# Patient Record
Sex: Female | Born: 1975 | Race: White | Hispanic: No | Marital: Married | Smoking: Current every day smoker
Health system: Southern US, Community
[De-identification: ages and names within clinical notes are randomized; demographics above are authoritative.]

## PROBLEM LIST (undated history)

## (undated) DIAGNOSIS — Z789 Other specified health status: Secondary | ICD-10-CM

## (undated) DIAGNOSIS — M1990 Unspecified osteoarthritis, unspecified site: Secondary | ICD-10-CM

## (undated) HISTORY — DX: Unspecified osteoarthritis, unspecified site: M19.90

---

## 2004-03-24 HISTORY — PX: CHOLECYSTECTOMY: SHX55

## 2012-03-24 HISTORY — PX: APPENDECTOMY: SHX54

## 2012-03-30 ENCOUNTER — Encounter (HOSPITAL_COMMUNITY): Payer: Self-pay | Admitting: Anesthesiology

## 2012-03-30 ENCOUNTER — Inpatient Hospital Stay (HOSPITAL_BASED_OUTPATIENT_CLINIC_OR_DEPARTMENT_OTHER)
Admission: EM | Admit: 2012-03-30 | Discharge: 2012-04-01 | DRG: 883 | Disposition: A | Discharge status: Home / Self Care | Payer: BC Managed Care – PPO | Attending: General Surgery | Admitting: General Surgery

## 2012-03-30 ENCOUNTER — Encounter (HOSPITAL_BASED_OUTPATIENT_CLINIC_OR_DEPARTMENT_OTHER): Payer: Self-pay | Admitting: Family Medicine

## 2012-03-30 ENCOUNTER — Emergency Department (HOSPITAL_COMMUNITY): Payer: BC Managed Care – PPO | Admitting: Anesthesiology

## 2012-03-30 ENCOUNTER — Emergency Department (HOSPITAL_BASED_OUTPATIENT_CLINIC_OR_DEPARTMENT_OTHER): Payer: BC Managed Care – PPO

## 2012-03-30 ENCOUNTER — Encounter (HOSPITAL_COMMUNITY): Admission: EM | Disposition: A | Discharge status: Home / Self Care | Payer: Self-pay | Source: Home / Self Care

## 2012-03-30 ENCOUNTER — Encounter (HOSPITAL_COMMUNITY): Payer: Self-pay | Admitting: *Deleted

## 2012-03-30 DIAGNOSIS — F17200 Nicotine dependence, unspecified, uncomplicated: Secondary | ICD-10-CM | POA: Diagnosis present

## 2012-03-30 DIAGNOSIS — K35209 Acute appendicitis with generalized peritonitis, without abscess, unspecified as to perforation: Principal | ICD-10-CM | POA: Diagnosis present

## 2012-03-30 DIAGNOSIS — Z6831 Body mass index (BMI) 31.0-31.9, adult: Secondary | ICD-10-CM

## 2012-03-30 DIAGNOSIS — K352 Acute appendicitis with generalized peritonitis, without abscess: Principal | ICD-10-CM | POA: Diagnosis present

## 2012-03-30 DIAGNOSIS — K3580 Unspecified acute appendicitis: Secondary | ICD-10-CM

## 2012-03-30 DIAGNOSIS — E669 Obesity, unspecified: Secondary | ICD-10-CM | POA: Diagnosis present

## 2012-03-30 DIAGNOSIS — K5900 Constipation, unspecified: Secondary | ICD-10-CM | POA: Diagnosis present

## 2012-03-30 HISTORY — PX: LAPAROSCOPIC APPENDECTOMY: SHX408

## 2012-03-30 HISTORY — DX: Other specified health status: Z78.9

## 2012-03-30 LAB — CBC WITH DIFFERENTIAL/PLATELET
Basophils Absolute: 0 10*3/uL (ref 0.0–0.1)
Eosinophils Absolute: 0 10*3/uL (ref 0.0–0.7)
Eosinophils Relative: 0 % (ref 0–5)
MCH: 30.1 pg (ref 26.0–34.0)
MCV: 87.6 fL (ref 78.0–100.0)
Platelets: 280 10*3/uL (ref 150–400)
RDW: 14 % (ref 11.5–15.5)

## 2012-03-30 LAB — URINALYSIS, ROUTINE W REFLEX MICROSCOPIC
Bilirubin Urine: NEGATIVE
Ketones, ur: 15 mg/dL — AB
Nitrite: NEGATIVE
Specific Gravity, Urine: 1.021 (ref 1.005–1.030)
Urobilinogen, UA: 1 mg/dL (ref 0.0–1.0)

## 2012-03-30 LAB — URINE MICROSCOPIC-ADD ON

## 2012-03-30 LAB — BASIC METABOLIC PANEL
Calcium: 9 mg/dL (ref 8.4–10.5)
GFR calc non Af Amer: >90 mL/min (ref >90)
Sodium: 137 mEq/L (ref 135–145)

## 2012-03-30 SURGERY — APPENDECTOMY, LAPAROSCOPIC
Anesthesia: General | Site: Abdomen | Wound class: Contaminated

## 2012-03-30 MED ORDER — OXYCODONE-ACETAMINOPHEN 5-325 MG PO TABS
1.0000 | ORAL_TABLET | ORAL | Status: DC | PRN
  Administered 2012-03-31 – 2012-04-01 (×2): 1 via ORAL
  Filled 2012-03-30 (×2): qty 1

## 2012-03-30 MED ORDER — SODIUM CHLORIDE 0.9 % IV SOLN
Freq: Once | INTRAVENOUS | Status: AC
Start: 2012-03-30 — End: 2012-03-30
  Administered 2012-03-30: 14:00:00 via INTRAVENOUS

## 2012-03-30 MED ORDER — 0.9 % SODIUM CHLORIDE (POUR BTL) OPTIME
TOPICAL | Status: DC | PRN
  Administered 2012-03-30: 1000 mL

## 2012-03-30 MED ORDER — MORPHINE SULFATE 2 MG/ML IJ SOLN
2.0000 mg | Freq: Once | INTRAMUSCULAR | Status: AC
  Administered 2012-03-30: 2 mg via INTRAVENOUS
  Filled 2012-03-30: qty 1

## 2012-03-30 MED ORDER — ONDANSETRON HCL 4 MG/2ML IJ SOLN
4.0000 mg | Freq: Once | INTRAMUSCULAR | Status: AC
  Administered 2012-03-30: 4 mg via INTRAVENOUS
  Filled 2012-03-30: qty 2

## 2012-03-30 MED ORDER — GLYCOPYRROLATE 0.2 MG/ML IJ SOLN
INTRAMUSCULAR | Status: DC | PRN
  Administered 2012-03-30: 0.6 mg via INTRAVENOUS

## 2012-03-30 MED ORDER — PROPOFOL 10 MG/ML IV BOLUS
INTRAVENOUS | Status: DC | PRN
  Administered 2012-03-30: 200 mg via INTRAVENOUS

## 2012-03-30 MED ORDER — CIPROFLOXACIN IN D5W 400 MG/200ML IV SOLN
400.0000 mg | Freq: Two times a day (BID) | INTRAVENOUS | Status: DC
  Administered 2012-03-31 – 2012-04-01 (×3): 400 mg via INTRAVENOUS
  Filled 2012-03-30 (×4): qty 200

## 2012-03-30 MED ORDER — MORPHINE SULFATE 4 MG/ML IJ SOLN: 6.0000 mg | Freq: Once | INTRAMUSCULAR | Status: DC

## 2012-03-30 MED ORDER — ACETAMINOPHEN 10 MG/ML IV SOLN
INTRAVENOUS | Status: DC | PRN
  Administered 2012-03-30: 1000 mg via INTRAVENOUS

## 2012-03-30 MED ORDER — LACTATED RINGERS IV SOLN
INTRAVENOUS | Status: DC | PRN
  Administered 2012-03-30: 21:00:00 via INTRAVENOUS

## 2012-03-30 MED ORDER — HYDROMORPHONE HCL PF 1 MG/ML IJ SOLN
INTRAMUSCULAR | Status: DC | PRN
  Administered 2012-03-30: 0.5 mg via INTRAVENOUS

## 2012-03-30 MED ORDER — MORPHINE SULFATE 4 MG/ML IJ SOLN: 4.0000 mg | INTRAMUSCULAR | Status: DC | PRN

## 2012-03-30 MED ORDER — HYDROMORPHONE HCL PF 1 MG/ML IJ SOLN
1.0000 mg | Freq: Once | INTRAMUSCULAR | Status: AC
  Administered 2012-03-30: 1 mg via INTRAVENOUS
  Filled 2012-03-30: qty 1

## 2012-03-30 MED ORDER — LACTATED RINGERS IR SOLN
Status: DC | PRN
  Administered 2012-03-30: 1000 mL

## 2012-03-30 MED ORDER — BUPIVACAINE-EPINEPHRINE PF 0.25-1:200000 % IJ SOLN
INTRAMUSCULAR | Status: DC | PRN
  Administered 2012-03-30: 12 mL

## 2012-03-30 MED ORDER — DEXAMETHASONE SODIUM PHOSPHATE 4 MG/ML IJ SOLN
INTRAMUSCULAR | Status: DC | PRN
  Administered 2012-03-30: 10 mg via INTRAVENOUS

## 2012-03-30 MED ORDER — IOHEXOL 300 MG/ML  SOLN
36.0000 mL | Freq: Once | INTRAMUSCULAR | Status: AC | PRN
  Administered 2012-03-30: 36 mL via ORAL

## 2012-03-30 MED ORDER — METRONIDAZOLE IN NACL 5-0.79 MG/ML-% IV SOLN
500.0000 mg | Freq: Three times a day (TID) | INTRAVENOUS | Status: DC
  Administered 2012-03-30 – 2012-04-01 (×5): 500 mg via INTRAVENOUS
  Filled 2012-03-30 (×7): qty 100

## 2012-03-30 MED ORDER — ROCURONIUM BROMIDE 100 MG/10ML IV SOLN
INTRAVENOUS | Status: DC | PRN
  Administered 2012-03-30: 20 mg via INTRAVENOUS

## 2012-03-30 MED ORDER — KETAMINE HCL 10 MG/ML IJ SOLN
INTRAMUSCULAR | Status: DC | PRN
  Administered 2012-03-30: 2 mg via INTRAVENOUS
  Administered 2012-03-30: 25 mg via INTRAVENOUS
  Administered 2012-03-30 (×2): 2 mg via INTRAVENOUS

## 2012-03-30 MED ORDER — NEOSTIGMINE METHYLSULFATE 1 MG/ML IJ SOLN
INTRAMUSCULAR | Status: DC | PRN
  Administered 2012-03-30: 5 mg via INTRAVENOUS

## 2012-03-30 MED ORDER — METOCLOPRAMIDE HCL 5 MG/ML IJ SOLN
INTRAMUSCULAR | Status: DC | PRN
  Administered 2012-03-30: 10 mg via INTRAVENOUS

## 2012-03-30 MED ORDER — KCL IN DEXTROSE-NACL 20-5-0.45 MEQ/L-%-% IV SOLN
INTRAVENOUS | Status: DC
  Administered 2012-03-30: 23:00:00 via INTRAVENOUS
  Filled 2012-03-30 (×4): qty 1000

## 2012-03-30 MED ORDER — HYDROMORPHONE HCL PF 1 MG/ML IJ SOLN: 0.2500 mg | INTRAMUSCULAR | Status: DC | PRN

## 2012-03-30 MED ORDER — OXYCODONE HCL 5 MG/5ML PO SOLN
5.0000 mg | Freq: Once | ORAL | Status: DC | PRN
  Filled 2012-03-30: qty 5

## 2012-03-30 MED ORDER — ENOXAPARIN SODIUM 40 MG/0.4ML ~~LOC~~ SOLN
40.0000 mg | SUBCUTANEOUS | Status: DC
  Administered 2012-03-31: 40 mg via SUBCUTANEOUS
  Filled 2012-03-30 (×2): qty 0.4

## 2012-03-30 MED ORDER — IOHEXOL 300 MG/ML  SOLN
100.0000 mL | Freq: Once | INTRAMUSCULAR | Status: AC | PRN
  Administered 2012-03-30: 100 mL via INTRAVENOUS

## 2012-03-30 MED ORDER — CIPROFLOXACIN IN D5W 400 MG/200ML IV SOLN
400.0000 mg | Freq: Once | INTRAVENOUS | Status: AC
  Administered 2012-03-30: 400 mg via INTRAVENOUS
  Filled 2012-03-30: qty 200

## 2012-03-30 MED ORDER — ACETAMINOPHEN 10 MG/ML IV SOLN
INTRAVENOUS | Status: AC
  Filled 2012-03-30: qty 100

## 2012-03-30 MED ORDER — OXYCODONE HCL 5 MG PO TABS: 5.0000 mg | ORAL_TABLET | Freq: Once | ORAL | Status: DC | PRN

## 2012-03-30 MED ORDER — ACETAMINOPHEN 325 MG PO TABS: 650.0000 mg | ORAL_TABLET | ORAL | Status: DC | PRN

## 2012-03-30 MED ORDER — SUCCINYLCHOLINE CHLORIDE 20 MG/ML IJ SOLN
INTRAMUSCULAR | Status: DC | PRN
  Administered 2012-03-30: 100 mg via INTRAVENOUS

## 2012-03-30 MED ORDER — ACETAMINOPHEN 10 MG/ML IV SOLN: 1000.0000 mg | Freq: Once | INTRAVENOUS | Status: DC | PRN

## 2012-03-30 MED ORDER — ONDANSETRON HCL 4 MG/2ML IJ SOLN
INTRAMUSCULAR | Status: DC | PRN
  Administered 2012-03-30: 4 mg via INTRAVENOUS

## 2012-03-30 MED ORDER — PHENYLEPHRINE HCL 10 MG/ML IJ SOLN
INTRAMUSCULAR | Status: DC | PRN
  Administered 2012-03-30: 40 ug via INTRAVENOUS

## 2012-03-30 MED ORDER — MORPHINE SULFATE 4 MG/ML IJ SOLN
4.0000 mg | Freq: Once | INTRAMUSCULAR | Status: AC
  Administered 2012-03-30: 4 mg via INTRAVENOUS
  Filled 2012-03-30: qty 1

## 2012-03-30 MED ORDER — BUPIVACAINE-EPINEPHRINE 0.25% -1:200000 IJ SOLN
INTRAMUSCULAR | Status: AC
  Filled 2012-03-30: qty 1

## 2012-03-30 MED ORDER — FENTANYL CITRATE 0.05 MG/ML IJ SOLN
INTRAMUSCULAR | Status: DC | PRN
  Administered 2012-03-30 (×2): 50 ug via INTRAVENOUS

## 2012-03-30 MED ORDER — KETOROLAC TROMETHAMINE 30 MG/ML IJ SOLN
30.0000 mg | Freq: Four times a day (QID) | INTRAMUSCULAR | Status: DC
  Administered 2012-03-30 – 2012-04-01 (×6): 30 mg via INTRAVENOUS
  Filled 2012-03-30 (×6): qty 1

## 2012-03-30 MED ORDER — PROMETHAZINE HCL 25 MG/ML IJ SOLN: 6.2500 mg | INTRAMUSCULAR | Status: DC | PRN

## 2012-03-30 MED ORDER — MEPERIDINE HCL 50 MG/ML IJ SOLN: 6.2500 mg | INTRAMUSCULAR | Status: DC | PRN

## 2012-03-30 MED ORDER — ONDANSETRON HCL 4 MG/2ML IJ SOLN: 4.0000 mg | Freq: Four times a day (QID) | INTRAMUSCULAR | Status: DC | PRN

## 2012-03-30 MED ORDER — ONDANSETRON HCL 4 MG PO TABS: 4.0000 mg | ORAL_TABLET | Freq: Four times a day (QID) | ORAL | Status: DC | PRN

## 2012-03-30 SURGICAL SUPPLY — 50 items
APPLIER CLIP 5 13 M/L LIGAMAX5 (MISCELLANEOUS)
APPLIER CLIP ROT 10 11.4 M/L (STAPLE)
BENZOIN TINCTURE PRP APPL 2/3 (GAUZE/BANDAGES/DRESSINGS) ×2 IMPLANT
CANISTER SUCTION 2500CC (MISCELLANEOUS) ×2 IMPLANT
CLIP APPLIE 5 13 M/L LIGAMAX5 (MISCELLANEOUS) IMPLANT
CLIP APPLIE ROT 10 11.4 M/L (STAPLE) IMPLANT
CLOTH BEACON ORANGE TIMEOUT ST (SAFETY) ×2 IMPLANT
CORD HIGH FREQUENCY UNIPOLAR (ELECTROSURGICAL) ×2 IMPLANT
CUTTER FLEX LINEAR 45M (STAPLE) ×2 IMPLANT
DECANTER SPIKE VIAL GLASS SM (MISCELLANEOUS) ×2 IMPLANT
DRAPE LAPAROSCOPIC ABDOMINAL (DRAPES) ×2 IMPLANT
DRAPE UTILITY XL STRL (DRAPES) ×2 IMPLANT
DRSG TEGADERM 2-3/8X2-3/4 SM (GAUZE/BANDAGES/DRESSINGS) ×4 IMPLANT
DRSG TEGADERM 4X4.75 (GAUZE/BANDAGES/DRESSINGS) ×2 IMPLANT
ELECT REM PT RETURN 9FT ADLT (ELECTROSURGICAL) ×2
ELECTRODE REM PT RTRN 9FT ADLT (ELECTROSURGICAL) ×1 IMPLANT
FILTER SMOKE EVAC LAPAROSHD (FILTER) IMPLANT
GAUZE SPONGE 2X2 8PLY STRL LF (GAUZE/BANDAGES/DRESSINGS) ×1 IMPLANT
GLOVE BIO SURGEON STRL SZ7 (GLOVE) ×2 IMPLANT
GLOVE BIOGEL PI IND STRL 7.0 (GLOVE) ×1 IMPLANT
GLOVE BIOGEL PI IND STRL 7.5 (GLOVE) ×1 IMPLANT
GLOVE BIOGEL PI INDICATOR 7.0 (GLOVE) ×1
GLOVE BIOGEL PI INDICATOR 7.5 (GLOVE) ×1
GLOVE SURG SS PI 8.5 STRL IVOR (GLOVE) ×2
GLOVE SURG SS PI 8.5 STRL STRW (GLOVE) ×2 IMPLANT
GOWN PREVENTION PLUS XXLARGE (GOWN DISPOSABLE) ×2 IMPLANT
GOWN STRL NON-REIN LRG LVL3 (GOWN DISPOSABLE) ×2 IMPLANT
GOWN STRL REIN XL XLG (GOWN DISPOSABLE) IMPLANT
HAND ACTIVATED (MISCELLANEOUS) ×2 IMPLANT
IV LACTATED RINGERS 1000ML (IV SOLUTION) ×2 IMPLANT
KIT BASIN OR (CUSTOM PROCEDURE TRAY) ×2 IMPLANT
NS IRRIG 1000ML POUR BTL (IV SOLUTION) ×2 IMPLANT
PENCIL BUTTON HOLSTER BLD 10FT (ELECTRODE) IMPLANT
POUCH SPECIMEN RETRIEVAL 10MM (ENDOMECHANICALS) ×2 IMPLANT
RELOAD 45 VASCULAR/THIN (ENDOMECHANICALS) IMPLANT
RELOAD STAPLE TA45 3.5 REG BLU (ENDOMECHANICALS) ×2 IMPLANT
SCISSORS LAP 5X35 DISP (ENDOMECHANICALS) ×2 IMPLANT
SET IRRIG TUBING LAPAROSCOPIC (IRRIGATION / IRRIGATOR) ×2 IMPLANT
SOLUTION ANTI FOG 6CC (MISCELLANEOUS) ×2 IMPLANT
SPONGE GAUZE 2X2 STER 10/PKG (GAUZE/BANDAGES/DRESSINGS) ×1
STRIP CLOSURE SKIN 1/2X4 (GAUZE/BANDAGES/DRESSINGS) ×2 IMPLANT
SUT MNCRL AB 4-0 PS2 18 (SUTURE) ×2 IMPLANT
SUT VICRYL 0 ENDOLOOP (SUTURE) IMPLANT
TOWEL OR 17X26 10 PK STRL BLUE (TOWEL DISPOSABLE) ×2 IMPLANT
TRAY FOLEY CATH 14FRSI W/METER (CATHETERS) IMPLANT
TRAY LAP CHOLE (CUSTOM PROCEDURE TRAY) ×2 IMPLANT
TROCAR BLADELESS OPT 5 75 (ENDOMECHANICALS) ×4 IMPLANT
TROCAR XCEL 12X100 BLDLESS (ENDOMECHANICALS) IMPLANT
TROCAR XCEL BLUNT TIP 100MML (ENDOMECHANICALS) ×2 IMPLANT
TUBING INSUFFLATION 10FT LAP (TUBING) ×2 IMPLANT

## 2012-03-30 NOTE — ED Notes (Signed)
Patient transported to X-ray 

## 2012-03-30 NOTE — ED Notes (Signed)
Pt c/o lower abdominal pain x 2 days, constant and sharp. Pt c/o nausea and sts she had 1 episode of vomiting this morning. Pt sts last bowel movement was last Thursday and normally bowel movements are daily.

## 2012-03-30 NOTE — Preoperative (Signed)
Beta Blockers   Reason not to administer Beta Blockers:Not Applicable 

## 2012-03-30 NOTE — ED Provider Notes (Signed)
History     CSN: [id number]  Arrival date & time 03/30/12  1147   First MD Initiated Contact with Patient 03/30/12 1222      Chief Complaint  Patient presents with  . Abdominal Pain    (Consider location/radiation/quality/duration/timing/severity/associated sxs/prior treatment) Patient is a 36 y.o. female presenting with abdominal pain. The history is provided by the patient.  Abdominal Pain The primary symptoms of the illness include abdominal pain, nausea and vomiting. The primary symptoms of the illness do not include fever, diarrhea, dysuria, vaginal discharge or vaginal bleeding. The current episode started yesterday. The onset of the illness was sudden. The problem has been gradually worsening.  Additional symptoms associated with the illness include anorexia and constipation. Symptoms associated with the illness do not include chills, diaphoresis, urgency, hematuria, frequency or back pain.  PT states pain mainly in the lower abdomen. Nausea, vomiting today. No fever. No urinary symptoms. Last bowel movement 4-5 days ago. Did not take any medications prior to the arrival. Pain worsened with palpation, movement.   History reviewed. No pertinent past medical history.  Past Surgical History  Procedure Date  . Cholecystectomy   . Cesarean section     No family history on file.  History  Substance Use Topics  . Smoking status: Current Every Day Smoker  . Smokeless tobacco: Not on file  . Alcohol Use: No    OB History    Grav Para Term Preterm Abortions TAB SAB Ect Mult Living                  Review of Systems  Constitutional: Negative for fever, chills and diaphoresis.  Respiratory: Negative.   Cardiovascular: Negative.   Gastrointestinal: Positive for nausea, vomiting, abdominal pain, constipation and anorexia. Negative for diarrhea and blood in stool.  Genitourinary: Negative for dysuria, urgency, frequency, hematuria, vaginal bleeding and vaginal discharge.    Musculoskeletal: Negative for back pain.  Skin: Negative.   Neurological: Negative for dizziness, weakness and headaches.    Allergies  Penicillins  Home Medications  No current outpatient prescriptions on file.  BP 119/76  Pulse 74  Temp 97.8 F (36.6 C) (Oral)  Resp 18  Ht 5\' 5"  (1.651 m)  Wt 192 lb (87.091 kg)  BMI 31.95 kg/m2  SpO2 99%  LMP 03/22/2012  Physical Exam  Nursing note and vitals reviewed. Constitutional: She appears well-developed and well-nourished. No distress.  HENT:  Head: Normocephalic.  Eyes: Conjunctivae normal are normal.  Neck: Neck supple.  Cardiovascular: Normal rate, regular rhythm and normal heart sounds.   Pulmonary/Chest: Effort normal. No respiratory distress. She has no wheezes. She has no rales.  Abdominal: Soft. Bowel sounds are normal. There is no rebound and no guarding.       Obese. LLQ and RLQ tenderness  Neurological: She is alert.  Skin: Skin is warm and dry.    ED Course  Procedures (including critical care time)    Results for orders placed during the hospital encounter of 03/30/12  URINALYSIS, ROUTINE W REFLEX MICROSCOPIC      Component Value Range   Color, Urine YELLOW  YELLOW   APPearance CLOUDY (*) CLEAR   Specific Gravity, Urine 1.021  1.005 - 1.030   pH 5.5  5.0 - 8.0   Glucose, UA NEGATIVE  NEGATIVE mg/dL   Hgb urine dipstick TRACE (*) NEGATIVE   Bilirubin Urine NEGATIVE  NEGATIVE   Ketones, ur 15 (*) NEGATIVE mg/dL   Protein, ur NEGATIVE  NEGATIVE mg/dL  Urobilinogen, UA 1.0  0.0 - 1.0 mg/dL   Nitrite NEGATIVE  NEGATIVE   Leukocytes, UA TRACE (*) NEGATIVE  PREGNANCY, URINE      Component Value Range   Preg Test, Ur NEGATIVE  NEGATIVE  CBC WITH DIFFERENTIAL      Component Value Range   WBC 15.4 (*) 4.0 - 10.5 K/uL   RBC 4.28  3.87 - 5.11 MIL/uL   Hemoglobin 12.9  12.0 - 15.0 g/dL   HCT 16.1  09.6 - 04.5 %   MCV 87.6  78.0 - 100.0 fL   MCH 30.1  26.0 - 34.0 pg   MCHC 34.4  30.0 - 36.0 g/dL   RDW  40.9  81.1 - 91.4 %   Platelets 280  150 - 400 K/uL   Neutrophils Relative 87 (*) 43 - 77 %   Neutro Abs 13.4 (*) 1.7 - 7.7 K/uL   Lymphocytes Relative 8 (*) 12 - 46 %   Lymphs Abs 1.2  0.7 - 4.0 K/uL   Monocytes Relative 5  3 - 12 %   Monocytes Absolute 0.7  0.1 - 1.0 K/uL   Eosinophils Relative 0  0 - 5 %   Eosinophils Absolute 0.0  0.0 - 0.7 K/uL   Basophils Relative 0  0 - 1 %   Basophils Absolute 0.0  0.0 - 0.1 K/uL  BASIC METABOLIC PANEL      Component Value Range   Sodium 137  135 - 145 mEq/L   Potassium 3.7  3.5 - 5.1 mEq/L   Chloride 103  96 - 112 mEq/L   CO2 22  19 - 32 mEq/L   Glucose, Bld 112 (*) 70 - 99 mg/dL   BUN 6  6 - 23 mg/dL   Creatinine, Ser 7.82  0.50 - 1.10 mg/dL   Calcium 9.0  8.4 - 95.6 mg/dL   GFR calc non Af Amer >90  >90 mL/min   GFR calc Af Amer >90  >90 mL/min  URINE MICROSCOPIC-ADD ON      Component Value Range   Squamous Epithelial / LPF RARE  RARE   WBC, UA 0-2  <3 WBC/hpf   RBC / HPF 3-6  <3 RBC/hpf   Bacteria, UA RARE  RARE   Urine-Other MUCOUS PRESENT     Ct Abdomen Pelvis W Contrast  03/30/2012  *RADIOLOGY REPORT*  Clinical Data: Lower abdominal pain.  Vomiting.  CT ABDOMEN AND PELVIS WITH CONTRAST  Technique:  Multidetector CT imaging of the abdomen and pelvis was performed following the standard protocol during bolus administration of intravenous contrast.  Contrast: 36mL OMNIPAQUE IOHEXOL 300 MG/ML  SOLN, OMNIPAQUE IOHEXOL 300 MG/ML  SOLN  Comparison:  03/30/2012  Findings:  Contrast in the esophagus suggests gastroesophageal reflux.  The liver, spleen, pancreas, and adrenal glands appear unremarkable.  Gallbladder surgically absent.  2 mm left kidney upper pole nonobstructive calculus observed.  No right-sided calculus or ureteral calculus.  Acute appendicitis noted with appendiceal diameter at 1.2 cm and with periappendiceal stranding.  Adjacent reactive lymph nodes are present.  The cecum is in the lower anterior pelvis, with the  appendix just above the anterior to the right adnexa.  There appears to be a sigmoid diverticulum on image 69 of series 2 adjacent to the inflamed appendix, but the diverticulum is not thought to be the source of the inflammation.  A small amount of free pelvic fluid is present in the cul-de-sac. Urinary bladder unremarkable.  Intervertebral spurring noted at the  L5-S1 level potentially contributing to mild right foraminal stenosis.  No extraluminal gas or discrete abscess observed.  IMPRESSION:  1.  Acute unruptured appendicitis with surrounding inflammatory stranding. 2.  Nonobstructive left nephrolithiasis. 3.  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 4.  Small amount of free pelvic fluid. 5.  Intervertebral spurring at L5-S1 contributes to mild right foraminal stenosis. 6.  Suspected gastroesophageal reflux.  I discussed the presence of appendicitis by telephone with Dr. Gwyneth Sprout at 3:48 p.m. on 03/30/2012.   Original Report Authenticated By: Dellia Cloud, M.D.    Dg Abd 2 Views  03/30/2012  *RADIOLOGY REPORT*  Clinical Data: Small bowel obstruction  ABDOMEN - 2 VIEW  Comparison: None.  Findings: There is no free intraperitoneal gas.   Nonspecific air fluid levels.  Post cholecystectomy staples in place.  No obvious bowel wall thickening or bowel dilatation. Tiny calcification projects over the left renal shadow.  IMPRESSION: Nonobstructive bowel gas pattern.  No free intraperitoneal gas. Possible left nephrolithiasis.   Original Report Authenticated By: Donavan Burnet, M.D.    CT showing acute appy, no perforation or abscess.  Spoke with Air traffic controller. Dr. Ezzard Standing accepted transfer to [facility], Dr. Margaree Mackintosh to take to surgery. Pt feeling better. Advised NOT to start antibiotic prior to transfer.   1. Acute appendicitis       MDM          Lottie Mussel, PA 03/31/12 0031

## 2012-03-30 NOTE — H&P (Signed)
Deborah Dodson is an 36 y.o. female.   Chief Complaint: Lower abdominal pain/ nausea/ vomiting HPI: 36 yo female in good health presents with 2 days of lower abdominal pain that has become more severe, nausea, vomiting. She has been constipated for several days.  She presented to [facility] for evaluation and was diagnosed with acute appendicitis. She is transferred to [facility] for further evaluation.  Past Medical History  Diagnosis Date  . No pertinent past medical history     Past Surgical History  Procedure Date  . Cholecystectomy   . Cesarean section     History reviewed. No pertinent family history. Social History:  reports that she has been smoking Cigarettes.  She has a 8.5 pack-year smoking history. She has never used smokeless tobacco. She reports that she does not drink alcohol or use illicit drugs.  Allergies:  Allergies  Allergen Reactions  . Penicillins      (Not in a hospital admission)  Results for orders placed during the hospital encounter of 03/30/12 (from the past 48 hour(s))  URINALYSIS, ROUTINE W REFLEX MICROSCOPIC     Status: Abnormal   Collection Time   03/30/12 12:33 PM      Component Value Range Comment   Color, Urine YELLOW  YELLOW    APPearance CLOUDY (*) CLEAR    Specific Gravity, Urine 1.021  1.005 - 1.030    pH 5.5  5.0 - 8.0    Glucose, UA NEGATIVE  NEGATIVE mg/dL    Hgb urine dipstick TRACE (*) NEGATIVE    Bilirubin Urine NEGATIVE  NEGATIVE    Ketones, ur 15 (*) NEGATIVE mg/dL    Protein, ur NEGATIVE  NEGATIVE mg/dL    Urobilinogen, UA 1.0  0.0 - 1.0 mg/dL    Nitrite NEGATIVE  NEGATIVE    Leukocytes, UA TRACE (*) NEGATIVE   PREGNANCY, URINE     Status: Normal   Collection Time   03/30/12 12:33 PM      Component Value Range Comment   Preg Test, Ur NEGATIVE  NEGATIVE   URINE MICROSCOPIC-ADD ON     Status: Normal   Collection Time   03/30/12 12:33 PM      Component Value Range Comment   Squamous Epithelial / LPF RARE  RARE    WBC, UA 0-2  <3  WBC/hpf    RBC / HPF 3-6  <3 RBC/hpf    Bacteria, UA RARE  RARE    Urine-Other MUCOUS PRESENT     CBC WITH DIFFERENTIAL     Status: Abnormal   Collection Time   03/30/12  1:55 PM      Component Value Range Comment   WBC 15.4 (*) 4.0 - 10.5 K/uL    RBC 4.28  3.87 - 5.11 MIL/uL    Hemoglobin 12.9  12.0 - 15.0 g/dL    HCT 16.1  09.6 - 04.5 %    MCV 87.6  78.0 - 100.0 fL    MCH 30.1  26.0 - 34.0 pg    MCHC 34.4  30.0 - 36.0 g/dL    RDW 40.9  81.1 - 91.4 %    Platelets 280  150 - 400 K/uL    Neutrophils Relative 87 (*) 43 - 77 %    Neutro Abs 13.4 (*) 1.7 - 7.7 K/uL    Lymphocytes Relative 8 (*) 12 - 46 %    Lymphs Abs 1.2  0.7 - 4.0 K/uL    Monocytes Relative 5  3 - 12 %  Monocytes Absolute 0.7  0.1 - 1.0 K/uL    Eosinophils Relative 0  0 - 5 %    Eosinophils Absolute 0.0  0.0 - 0.7 K/uL    Basophils Relative 0  0 - 1 %    Basophils Absolute 0.0  0.0 - 0.1 K/uL   BASIC METABOLIC PANEL     Status: Abnormal   Collection Time   03/30/12  1:55 PM      Component Value Range Comment   Sodium 137  135 - 145 mEq/L    Potassium 3.7  3.5 - 5.1 mEq/L    Chloride 103  96 - 112 mEq/L    CO2 22  19 - 32 mEq/L    Glucose, Bld 112 (*) 70 - 99 mg/dL    BUN 6  6 - 23 mg/dL    Creatinine, Ser 4.09  0.50 - 1.10 mg/dL    Calcium 9.0  8.4 - 81.1 mg/dL    GFR calc non Af Amer >90  >90 mL/min    GFR calc Af Amer >90  >90 mL/min    Ct Abdomen Pelvis W Contrast  03/30/2012  *RADIOLOGY REPORT*  Clinical Data: Lower abdominal pain.  Vomiting.  CT ABDOMEN AND PELVIS WITH CONTRAST  Technique:  Multidetector CT imaging of the abdomen and pelvis was performed following the standard protocol during bolus administration of intravenous contrast.  Contrast: 36mL OMNIPAQUE IOHEXOL 300 MG/ML  SOLN, OMNIPAQUE IOHEXOL 300 MG/ML  SOLN  Comparison:  03/30/2012  Findings:  Contrast in the esophagus suggests gastroesophageal reflux.  The liver, spleen, pancreas, and adrenal glands appear unremarkable.  Gallbladder  surgically absent.  2 mm left kidney upper pole nonobstructive calculus observed.  No right-sided calculus or ureteral calculus.  Acute appendicitis noted with appendiceal diameter at 1.2 cm and with periappendiceal stranding.  Adjacent reactive lymph nodes are present.  The cecum is in the lower anterior pelvis, with the appendix just above the anterior to the right adnexa.  There appears to be a sigmoid diverticulum on image 69 of series 2 adjacent to the inflamed appendix, but the diverticulum is not thought to be the source of the inflammation.  A small amount of free pelvic fluid is present in the cul-de-sac. Urinary bladder unremarkable.  Intervertebral spurring noted at the L5-S1 level potentially contributing to mild right foraminal stenosis.  No extraluminal gas or discrete abscess observed.  IMPRESSION:  1.  Acute unruptured appendicitis with surrounding inflammatory stranding. 2.  Nonobstructive left nephrolithiasis. 3.  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 4.  Small amount of free pelvic fluid. 5.  Intervertebral spurring at L5-S1 contributes to mild right foraminal stenosis. 6.  Suspected gastroesophageal reflux.  I discussed the presence of appendicitis by telephone with Dr. Gwyneth Sprout at 3:48 p.m. on 03/30/2012.   Original Report Authenticated By: Dellia Cloud, M.D.    Dg Abd 2 Views  03/30/2012  *RADIOLOGY REPORT*  Clinical Data: Small bowel obstruction  ABDOMEN - 2 VIEW  Comparison: None.  Findings: There is no free intraperitoneal gas.   Nonspecific air fluid levels.  Post cholecystectomy staples in place.  No obvious bowel wall thickening or bowel dilatation. Tiny calcification projects over the left renal shadow.  IMPRESSION: Nonobstructive bowel gas pattern.  No free intraperitoneal gas. Possible left nephrolithiasis.   Original Report Authenticated By: Donavan Burnet, M.D.     Review of Systems  Eyes: Negative.   Gastrointestinal: Positive for nausea, vomiting,  abdominal pain and constipation.  Neurological:  Negative.   Endo/Heme/Allergies: Negative.     Blood pressure 98/61, pulse 91, temperature 99.2 F (37.3 C), temperature source Oral, resp. rate 19, height 5\' 5"  (1.651 m), weight 192 lb (87.091 kg), last menstrual period 03/22/2012, SpO2 100.00%. Physical Exam  WDWN - mild distress Head: Normocephalic.  Eyes: Conjunctivae normal are normal.  Neck: Neck supple.  Cardiovascular: Normal rate, regular rhythm and normal heart sounds.  Pulmonary/Chest: Effort normal. No respiratory distress. She has no wheezes. She has no rales.  Abdominal: Soft. Bowel sounds are normal.  Obese. Tender in lower abdomen Neurological: She is alert.  Skin: Skin is warm and dry.   Assessment/Plan Acute appendicitis  Laparoscopic appendectomy, possible open appendectomy.  The surgical procedure has been discussed with the patient.  Potential risks, benefits, alternative treatments, and expected outcomes have been explained.  All of the patient's questions at this time have been answered.  The likelihood of reaching the patient's treatment goal is good.  The patient understand the proposed surgical procedure and wishes to proceed.   Daxtyn Rottenberg K. 03/30/2012, 7:34 PM

## 2012-03-30 NOTE — Anesthesia Postprocedure Evaluation (Signed)
Anesthesia Post Note  Patient: Deborah Dodson  Procedure(s) Performed: Procedure(s) (LRB): APPENDECTOMY LAPAROSCOPIC (N/A)  Anesthesia type: General  Patient location: PACU  Post pain: Pain level controlled  Post assessment: Post-op Vital signs reviewed  Last Vitals: BP 124/64  Pulse 107  Temp 37.8 C (Oral)  Resp 18  Ht 5\' 5"  (1.651 m)  Wt 192 lb (87.091 kg)  BMI 31.95 kg/m2  SpO2 98%  LMP 03/22/2012  Post vital signs: Reviewed  Level of consciousness: sedated  Complications: No apparent anesthesia complications

## 2012-03-30 NOTE — Anesthesia Preprocedure Evaluation (Addendum)
Anesthesia Evaluation  Patient identified by MRN, date of birth, ID band Patient awake    Reviewed: Allergy & Precautions, H&P , NPO status , Patient's Chart, lab work & pertinent test results  Airway Mallampati: III TM Distance: >3 FB Neck ROM: Full    Dental  (+) Dental Advisory Given and Teeth Intact   Pulmonary Current Smoker,  breath sounds clear to auscultation  Pulmonary exam normal       Cardiovascular - CAD and - CHF negative cardio ROS  Rhythm:Regular Rate:Normal     Neuro/Psych negative neurological ROS  negative psych ROS   GI/Hepatic negative GI ROS, Neg liver ROS,   Endo/Other  negative endocrine ROS  Renal/GU negative Renal ROS     Musculoskeletal negative musculoskeletal ROS (+)   Abdominal   Peds  Hematology negative hematology ROS (+)   Anesthesia Other Findings   Reproductive/Obstetrics                         Anesthesia Physical Anesthesia Plan  ASA: II  Anesthesia Plan: General   Post-op Pain Management:    Induction: Rapid sequence and Intravenous  Airway Management Planned: Oral ETT  Additional Equipment:   Intra-op Plan:   Post-operative Plan: Extubation in OR  Informed Consent: I have reviewed the patients History and Physical, chart, labs and discussed the procedure including the risks, benefits and alternatives for the proposed anesthesia with the patient or authorized representative who has indicated his/her understanding and acceptance.   Dental advisory given  Plan Discussed with: CRNA  Anesthesia Plan Comments:         Anesthesia Quick Evaluation

## 2012-03-30 NOTE — Op Note (Signed)
Appendectomy, Lap, Procedure Note  Indications: The patient presented with a history of right-sided abdominal pain. A CT scan revealed findings consistent with acute appendicitis.  Pre-operative Diagnosis: Acute appendicitis with generalized peritonitis  Post-operative Diagnosis: Perforated appendicitis with generalized peritonitis  Surgeon: Wynona Luna.   Assistants: none  Anesthesia: General endotracheal anesthesia  ASA Class: 2E  Procedure Details  The patient was seen again in the Holding Room. The risks, benefits, complications, treatment options, and expected outcomes were discussed with the patient and/or family. The possibilities of reaction to medication, perforation of viscus, bleeding, recurrent infection, finding a normal appendix, the need for additional procedures, failure to diagnose a condition, and creating a complication requiring transfusion or operation were discussed. There was concurrence with the proposed plan and informed consent was obtained. The site of surgery was properly noted. The patient was taken to Operating Room, identified as Davene Jobin and the procedure verified as Appendectomy. A Time Out was held and the above information confirmed.  The patient was placed in the supine position and general anesthesia was induced.  The abdomen was prepped and draped in a sterile fashion. A one centimeter supraumbilical incision was made.  Dissection was carried down to the fascia bluntly.  The fascia was incised vertically.  We entered the peritoneal cavity bluntly.  A pursestring suture was passed around the incision with a 0 Vicryl.  The Hasson cannula was introduced into the abdomen and the tails of the suture were used to hold the Hasson in place.   The pneumoperitoneum was then established maintaining a maximum pressure of 15 mmHg.  Additional 5 mm cannulas then placed in the left lower quadrant of the abdomen and the right upper quadrant under direct  visualization. A careful evaluation of the entire abdomen was carried out and obvious peritoneal irritation was noted in the lower abdomen. The patient was placed in Trendelenburg and left lateral decubitus position.  The scope was moved to the right upper quadrant port site. The cecum was mobilized medially.  The appendix was quite inflamed and adherent to the surrounding bowel.  We bluntly dissected the appendix free from the surrounding tissue.  There was an area of obvious perforation near the base of the appendix.  The appendix was carefully dissected. The appendix was was skeletonized with the harmonic scalpel.   The appendix was divided at its base using an endo-GIA stapler. No appendiceal stump was left in place. There was no evidence of bleeding, leakage, or complication after division of the appendix. Irrigation was also performed and irrigate suctioned from the abdomen as well.  A spilled fecalith was identified and was also removed.  The umbilical port site was closed with the purse string suture. There was no residual palpable fascial defect.  The trocar site skin wounds were closed with 4-0 Monocryl.  Instrument, sponge, and needle counts were correct at the conclusion of the case.   Findings: The appendix was found to be inflamed. There were signs of necrosis.  There was perforation. There was not abscess formation.  Estimated Blood Loss:  Minimal         Drains: none  Specimens: Appendix         Complications:  None; patient tolerated the procedure well.         Disposition: PACU - hemodynamically stable.         Condition: stable  Wilmon Arms. Corliss Skains, MD, [facility]  03/30/2012 9:22 PM

## 2012-03-30 NOTE — Transfer of Care (Signed)
Immediate Anesthesia Transfer of Care Note  Patient: Deborah Dodson  Procedure(s) Performed: Procedure(s) (LRB) with comments: APPENDECTOMY LAPAROSCOPIC (N/A)  Patient Location: PACU  Anesthesia Type: General  Level of Consciousness: awake, sedated and patient cooperative  Airway & Oxygen Therapy: Patient Spontanous Breathing and Patient connected to nasal cannula oxygen  Post-op Assessment: Report given to PACU RN, Post -op Vital signs reviewed and stable and Patient moving all extremities X 4  Post vital signs: Reviewed and stable  Complications: No apparent anesthesia complications

## 2012-03-31 ENCOUNTER — Encounter (HOSPITAL_COMMUNITY): Payer: Self-pay | Admitting: Surgery

## 2012-03-31 NOTE — ED Provider Notes (Signed)
Medical screening examination/treatment/procedure(s) were performed by non-physician practitioner and as supervising physician I was immediately available for consultation/collaboration.   Odyn Turko, MD 03/31/12 1702 

## 2012-03-31 NOTE — Progress Notes (Signed)
Patient ID: Deborah Dodson, female   DOB: March 22, 1976, 36 y.o.   MRN: [id number] 1 Day Post-Op  Subjective: Pt feels well this morning.  Pain is minimal.  Tolerated clear liquids for breakfast.  Objective: Vital signs in last 24 hours: Temp:  [97.6 F (36.4 C)-100 F (37.8 C)] 98.1 F (36.7 C) (10/08 0535) Pulse Rate:  [71-107] 73  (10/08 0535) Resp:  [15-26] 18  (10/08 0535) BP: (98-126)/(59-78) 102/63 mmHg (10/08 0535) SpO2:  [94 %-100 %] 96 % (10/08 0535) Weight:  [192 lb (87.09 kg)-192 lb (87.091 kg)] 192 lb (87.09 kg) (10/07 2228) Last BM Date: 03/28/12  Intake/Output from previous day: 10/07 0701 - 10/08 0700 In: 3033.8 [P.O.:960; I.V.:1773.8; IV Piggyback:300] Out: 1600 [Urine:1600] Intake/Output this shift: Total I/O In: -  Out: 300 [Urine:300]  PE: Abd: soft, minimally tender, incisions c/d/i, +BS  Lab Results:   Basename 03/30/12 1355  WBC 15.4*  HGB 12.9  HCT 37.5  PLT 280   BMET  Basename 03/30/12 1355  NA 137  K 3.7  CL 103  CO2 22  GLUCOSE 112*  BUN 6  CREATININE 0.70  CALCIUM 9.0   PT/INR No results found for this basename: LABPROT:2,INR:2 in the last 72 hours CMP     Component Value Date/Time   NA 137 03/30/2012 1355   K 3.7 03/30/2012 1355   CL 103 03/30/2012 1355   CO2 22 03/30/2012 1355   GLUCOSE 112* 03/30/2012 1355   BUN 6 03/30/2012 1355   CREATININE 0.70 03/30/2012 1355   CALCIUM 9.0 03/30/2012 1355   GFRNONAA >90 03/30/2012 1355   GFRAA >90 03/30/2012 1355   Lipase  No results found for this basename: lipase       Studies/Results: Ct Abdomen Pelvis W Contrast  03/30/2012  *RADIOLOGY REPORT*  Clinical Data: Lower abdominal pain.  Vomiting.  CT ABDOMEN AND PELVIS WITH CONTRAST  Technique:  Multidetector CT imaging of the abdomen and pelvis was performed following the standard protocol during bolus administration of intravenous contrast.  Contrast: 36mL OMNIPAQUE IOHEXOL 300 MG/ML  SOLN, OMNIPAQUE IOHEXOL 300 MG/ML  SOLN   Comparison:  03/30/2012  Findings:  Contrast in the esophagus suggests gastroesophageal reflux.  The liver, spleen, pancreas, and adrenal glands appear unremarkable.  Gallbladder surgically absent.  2 mm left kidney upper pole nonobstructive calculus observed.  No right-sided calculus or ureteral calculus.  Acute appendicitis noted with appendiceal diameter at 1.2 cm and with periappendiceal stranding.  Adjacent reactive lymph nodes are present.  The cecum is in the lower anterior pelvis, with the appendix just above the anterior to the right adnexa.  There appears to be a sigmoid diverticulum on image 69 of series 2 adjacent to the inflamed appendix, but the diverticulum is not thought to be the source of the inflammation.  A small amount of free pelvic fluid is present in the cul-de-sac. Urinary bladder unremarkable.  Intervertebral spurring noted at the L5-S1 level potentially contributing to mild right foraminal stenosis.  No extraluminal gas or discrete abscess observed.  IMPRESSION:  1.  Acute unruptured appendicitis with surrounding inflammatory stranding. 2.  Nonobstructive left nephrolithiasis. 3.  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 4.  Small amount of free pelvic fluid. 5.  Intervertebral spurring at L5-S1 contributes to mild right foraminal stenosis. 6.  Suspected gastroesophageal reflux.  I discussed the presence of appendicitis by telephone with Dr. Gwyneth Sprout at 3:48 p.m. on 03/30/2012.   Original Report Authenticated By: Dellia Cloud, M.D.  Dg Abd 2 Views  03/30/2012  *RADIOLOGY REPORT*  Clinical Data: Small bowel obstruction  ABDOMEN - 2 VIEW  Comparison: None.  Findings: There is no free intraperitoneal gas.   Nonspecific air fluid levels.  Post cholecystectomy staples in place.  No obvious bowel wall thickening or bowel dilatation. Tiny calcification projects over the left renal shadow.  IMPRESSION: Nonobstructive bowel gas pattern.  No free intraperitoneal gas.  Possible left nephrolithiasis.   Original Report Authenticated By: Donavan Burnet, M.D.     Anti-infectives: Anti-infectives     Start     Dose/Rate Route Frequency Ordered Stop   03/31/12 0600   ciprofloxacin (CIPRO) IVPB 400 mg        400 mg 200 mL/hr over 60 Minutes Intravenous Every 12 hours 03/30/12 2236     03/30/12 2245   metroNIDAZOLE (FLAGYL) IVPB 500 mg        500 mg 100 mL/hr over 60 Minutes Intravenous 3 times per day 03/30/12 2236     03/30/12 2000   ciprofloxacin (CIPRO) IVPB 400 mg        400 mg 200 mL/hr over 60 Minutes Intravenous  Once 03/30/12 1938 03/30/12 2048         Assessment/Plan  1. Perforated acute appendicitis, s/p lap appy  Plan: 1. Advance diet, cont IV abx therapy 2. If patient continues to do so well, anticipate dc home tomorrow.   LOS: 1 day   OSBORNE,KELLY E 03/31/2012, 10:03 AM Pager: [phone]  Agree with above.  Doing well.  Ovidio Kin, MD, [facility] Pager: [phone] Office phone:  [phone]

## 2012-03-31 NOTE — Care Management Note (Signed)
    Page 1 of 1   03/31/2012     11:51:31 AM   CARE MANAGEMENT NOTE 03/31/2012  Patient:  Deborah Dodson, Deborah Dodson   Account Number:  [id number]  Date Initiated:  03/31/2012  Documentation initiated by:  Lorenda Ishihara  Subjective/Objective Assessment:   36 yo female admitted perforated acute appendicitis, s/p lap appy. PTA lived at home with spouse.     Action/Plan:   Anticipated DC Date:  04/01/2012   Anticipated DC Plan:  HOME/SELF CARE      DC Planning Services  CM consult      Choice offered to / List presented to:             Status of service:  Completed, signed off Medicare Important Message given?   (If response is "NO", the following Medicare IM given date fields will be blank) Date Medicare IM given:   Date Additional Medicare IM given:    Discharge Disposition:  HOME/SELF CARE  Per UR Regulation:  Reviewed for med. necessity/level of care/duration of stay  If discussed at Long Length of Stay Meetings, dates discussed:    Comments:

## 2012-04-01 MED ORDER — OXYCODONE-ACETAMINOPHEN 5-325 MG PO TABS: 1.0000 | ORAL_TABLET | ORAL | Status: DC | PRN

## 2012-04-01 MED ORDER — CIPROFLOXACIN HCL 500 MG PO TABS: 500.0000 mg | ORAL_TABLET | Freq: Two times a day (BID) | ORAL | Status: DC

## 2012-04-01 MED ORDER — METRONIDAZOLE 500 MG PO TABS: 500.0000 mg | ORAL_TABLET | Freq: Three times a day (TID) | ORAL | Status: DC

## 2012-04-01 NOTE — Progress Notes (Signed)
Utilization review completed.  

## 2012-04-01 NOTE — Discharge Summary (Signed)
Patient ID: Deborah Dodson MRN: [id number] DOB/AGE: May 07, 1976 36 y.o.  Admit date: 03/30/2012 Discharge date: 04/01/2012  Procedures: lap appy - Dr. Gwyndolyn Kaufman  Consults: None  Reason for Admission: This is a 36 yo female who presented to [facility] with abdominal pain.  She was found to have acute appendicitis and was admitted.  Admission Diagnoses:  1. Acute appendicitis  Hospital Course: The patient was admitted and taken to the operating room where she was found to have a perforated appendix.  She tolerated this procedure well.  She was kept on IV abx therapy for 2 days post op.  Her diet was advanced as tolerated and her pain well controlled.  She was stable for dc home on PO#2.  PE: Abd: soft, minimally tender, +BS, incisions c/d/i  Discharge Diagnoses:  Acute perforated appendicitis, s/p lap appy  Discharge Medications:   Medication List     As of 04/01/2012 11:09 AM    TAKE these medications         ciprofloxacin 500 MG tablet   Commonly known as: CIPRO   Take 1 tablet (500 mg total) by mouth 2 (two) times daily.      ibuprofen 200 MG tablet   Commonly known as: ADVIL,MOTRIN   Take 200 mg by mouth every 6 (six) hours as needed. Pain.      metroNIDAZOLE 500 MG tablet   Commonly known as: FLAGYL   Take 1 tablet (500 mg total) by mouth 3 (three) times daily.      oxyCODONE-acetaminophen 5-325 MG per tablet   Commonly known as: PERCOCET/ROXICET   Take 1-2 tablets by mouth every 4 (four) hours as needed.        Discharge Instructions:     Follow-up Information    Follow up with [facility]. On 04/14/2012. (3:30pm, arrive at 3:00pm )    Contact information:   [address]   [place] [address] [phone]         Signed: Letha Cape 04/01/2012, 11:09 AM  Agree with above.  Ovidio Kin, M.D. [facility] [phone]

## 2012-04-04 ENCOUNTER — Encounter (HOSPITAL_COMMUNITY): Payer: Self-pay | Admitting: Emergency Medicine

## 2012-04-04 ENCOUNTER — Emergency Department (HOSPITAL_COMMUNITY)
Admission: EM | Admit: 2012-04-04 | Discharge: 2012-04-04 | Disposition: A | Discharge status: Home / Self Care | Payer: BC Managed Care – PPO | Attending: Emergency Medicine | Admitting: Emergency Medicine

## 2012-04-04 ENCOUNTER — Emergency Department (HOSPITAL_COMMUNITY): Payer: BC Managed Care – PPO

## 2012-04-04 DIAGNOSIS — R109 Unspecified abdominal pain: Secondary | ICD-10-CM

## 2012-04-04 DIAGNOSIS — F17200 Nicotine dependence, unspecified, uncomplicated: Secondary | ICD-10-CM | POA: Insufficient documentation

## 2012-04-04 DIAGNOSIS — Z79899 Other long term (current) drug therapy: Secondary | ICD-10-CM | POA: Insufficient documentation

## 2012-04-04 DIAGNOSIS — R112 Nausea with vomiting, unspecified: Secondary | ICD-10-CM | POA: Insufficient documentation

## 2012-04-04 LAB — CBC WITH DIFFERENTIAL/PLATELET
Basophils Relative: 1 % (ref 0–1)
Eosinophils Absolute: 0.2 10*3/uL (ref 0.0–0.7)
Eosinophils Relative: 2 % (ref 0–5)
MCV: 85.9 fL (ref 78.0–100.0)
Monocytes Absolute: 0.5 10*3/uL (ref 0.1–1.0)
Monocytes Relative: 5 % (ref 3–12)
RBC: 4.03 MIL/uL (ref 3.87–5.11)
WBC: 10.2 10*3/uL (ref 4.0–10.5)

## 2012-04-04 LAB — COMPREHENSIVE METABOLIC PANEL
Albumin: 3 g/dL — ABNORMAL LOW (ref 3.5–5.2)
BUN: 7 mg/dL (ref 6–23)
Creatinine, Ser: 0.7 mg/dL (ref 0.50–1.10)
Total Protein: 6.2 g/dL (ref 6.0–8.3)

## 2012-04-04 LAB — URINALYSIS, ROUTINE W REFLEX MICROSCOPIC
Glucose, UA: NEGATIVE mg/dL
Leukocytes, UA: NEGATIVE
Nitrite: NEGATIVE
Protein, ur: NEGATIVE mg/dL
pH: 7 (ref 5.0–8.0)

## 2012-04-04 MED ORDER — ONDANSETRON 8 MG PO TBDP: 8.0000 mg | ORAL_TABLET | Freq: Three times a day (TID) | ORAL | Status: DC | PRN

## 2012-04-04 MED ORDER — POTASSIUM CHLORIDE CRYS ER 20 MEQ PO TBCR
40.0000 meq | EXTENDED_RELEASE_TABLET | Freq: Once | ORAL | Status: AC
  Administered 2012-04-04: 40 meq via ORAL
  Filled 2012-04-04 (×2): qty 1

## 2012-04-04 MED ORDER — ONDANSETRON HCL 4 MG/2ML IJ SOLN
4.0000 mg | Freq: Once | INTRAMUSCULAR | Status: AC
  Administered 2012-04-04: 4 mg via INTRAVENOUS
  Filled 2012-04-04: qty 2

## 2012-04-04 MED ORDER — IOHEXOL 300 MG/ML  SOLN
100.0000 mL | Freq: Once | INTRAMUSCULAR | Status: AC | PRN
  Administered 2012-04-04: 100 mL via INTRAVENOUS

## 2012-04-04 MED ORDER — SODIUM CHLORIDE 0.9 % IV SOLN
INTRAVENOUS | Status: DC
  Administered 2012-04-04: 09:00:00 via INTRAVENOUS

## 2012-04-04 MED ORDER — HYDROMORPHONE HCL PF 1 MG/ML IJ SOLN
1.0000 mg | Freq: Once | INTRAMUSCULAR | Status: AC
  Administered 2012-04-04: 1 mg via INTRAVENOUS
  Filled 2012-04-04: qty 1

## 2012-04-04 NOTE — ED Notes (Signed)
Pain reassessed, pt sts she feels a lot better now,sts pain is 2-3 out of 10, pt also sts don't feel nauseated anymore. Will continue to monitor.

## 2012-04-04 NOTE — ED Notes (Addendum)
Received pt in rm 13, pt sts had appendectomy fo ruptured appendix on 10/07, started having abd.pain with nausea and vomiting today around 1:00 am, pt also reports fever between 99.9 -101. Pt is guarding abdomen, bowel sounds are audible x 4. MD present in room examining pt.

## 2012-04-04 NOTE — ED Notes (Signed)
Reports onset of nausea and vomiting around 200am. Patient is 5 days post op for appendectomy c/o of incision pain area 7/10. Last BM yesterday denies blood.

## 2012-04-04 NOTE — ED Provider Notes (Addendum)
History     CSN: [id number]  Arrival date & time 04/04/12  4098   First MD Initiated Contact with Patient 04/04/12 [phone]      Chief Complaint  Patient presents with  . Nausea    (Consider location/radiation/quality/duration/timing/severity/associated sxs/prior treatment) The history is provided by the patient.    patient here with abdominal pain with nausea and vomiting. Patient had a surgery for a ruptured appendicitis a few days ago. Notes low-grade temperature at home. Vomiting has been nonbilious. Denies any urinary symptoms. Nothing makes her symptoms better worse. Pain is diffuse and nonlocalized. She is currently on home antibiotics but has been unable to keep down  Past Medical History  Diagnosis Date  . No pertinent past medical history     Past Surgical History  Procedure Date  . Cholecystectomy   . Cesarean section   . Laparoscopic appendectomy 03/30/2012    Procedure: APPENDECTOMY LAPAROSCOPIC;  Surgeon: Wilmon Arms. Corliss Skains, MD;  Location: [facility]S;  Service: General;  Laterality: N/A;    History reviewed. No pertinent family history.  History  Substance Use Topics  . Smoking status: Current Every Day Smoker -- 0.5 packs/day for 17 years    Types: Cigarettes  . Smokeless tobacco: Never Used  . Alcohol Use: No    OB History    Grav Para Term Preterm Abortions TAB SAB Ect Mult Living                  Review of Systems  All other systems reviewed and are negative.    Allergies  Penicillins and Iodine  Home Medications   Current Outpatient Rx  Name Route Sig Dispense Refill  . CIPROFLOXACIN HCL 500 MG PO TABS Oral Take 1 tablet (500 mg total) by mouth 2 (two) times daily. 14 tablet 0  . IBUPROFEN 200 MG PO TABS Oral Take 200 mg by mouth every 6 (six) hours as needed. Pain.    Marland Kitchen METRONIDAZOLE 500 MG PO TABS Oral Take 1 tablet (500 mg total) by mouth 3 (three) times daily. 21 tablet 0  . OXYCODONE-ACETAMINOPHEN 5-325 MG PO TABS Oral Take 1-2 tablets by  mouth every 4 (four) hours as needed. 40 tablet 0    BP 129/71  Pulse 86  Temp 97.7 F (36.5 C) (Oral)  Resp 22  SpO2 97%  LMP 03/17/2012  Physical Exam  Nursing note and vitals reviewed. Constitutional: She is oriented to person, place, and time. She appears well-developed and well-nourished.  Non-toxic appearance. No distress.  HENT:  Head: Normocephalic and atraumatic.  Eyes: Conjunctivae normal, EOM and lids are normal. Pupils are equal, round, and reactive to light.  Neck: Normal range of motion. Neck supple. No tracheal deviation present. No mass present.  Cardiovascular: Normal rate, regular rhythm and normal heart sounds.  Exam reveals no gallop.   No murmur heard. Pulmonary/Chest: Effort normal and breath sounds normal. No stridor. No respiratory distress. She has no decreased breath sounds. She has no wheezes. She has no rhonchi. She has no rales.  Abdominal: Soft. Normal appearance and bowel sounds are normal. She exhibits no distension. There is generalized tenderness. There is no rigidity, no rebound, no guarding and no CVA tenderness.  Musculoskeletal: Normal range of motion. She exhibits no edema and no tenderness.  Neurological: She is alert and oriented to person, place, and time. She has normal strength. No cranial nerve deficit or sensory deficit. GCS eye subscore is 4. GCS verbal subscore is 5. GCS motor subscore is  6.  Skin: Skin is warm and dry. No abrasion and no rash noted.  Psychiatric: She has a normal mood and affect. Her speech is normal and behavior is normal.    ED Course  Procedures (including critical care time)   Labs Reviewed  CBC WITH DIFFERENTIAL  COMPREHENSIVE METABOLIC PANEL  URINALYSIS, ROUTINE W REFLEX MICROSCOPIC  URINE CULTURE  LIPASE, BLOOD   No results found.   No diagnosis found.    MDM  Patient given IV fluids, pain medication and anti-medics and feels better. Those that she will be B. keep down her medications at this time  is to be discharged home with a prescription for Zofran  Abdominal CT shows no increased size of her abscess      Toy Baker, MD 04/04/12 1136  Toy Baker, MD 04/04/12 1136

## 2012-04-04 NOTE — ED Notes (Signed)
Attempt to insert IV x1 unsuccessful, another RN  is at the bedside at this time to attempt IV insertion again.

## 2012-04-05 LAB — URINE CULTURE: Colony Count: NO GROWTH

## 2012-04-14 ENCOUNTER — Ambulatory Visit (INDEPENDENT_AMBULATORY_CARE_PROVIDER_SITE_OTHER): Payer: BC Managed Care – PPO | Admitting: Internal Medicine

## 2012-04-14 ENCOUNTER — Encounter (INDEPENDENT_AMBULATORY_CARE_PROVIDER_SITE_OTHER): Payer: Self-pay

## 2012-04-14 VITALS — BP 130/70 | HR 114 | Temp 98.4°F | Ht 65.5 in | Wt 236.6 lb

## 2012-04-14 DIAGNOSIS — K35209 Acute appendicitis with generalized peritonitis, without abscess, unspecified as to perforation: Secondary | ICD-10-CM

## 2012-04-14 DIAGNOSIS — K352 Acute appendicitis with generalized peritonitis, without abscess: Secondary | ICD-10-CM

## 2012-04-14 NOTE — Patient Instructions (Signed)
Resume regular activity without restrictions.

## 2012-04-14 NOTE — Progress Notes (Signed)
  Subjective: Pt returns to the clinic today after undergoing laparoscopic appendectomy on 03/30/12.  The patient is tolerating their diet well and is having no severe pain.  Bowel function is good.  No problems with the wounds.  Objective: Vital signs in last 24 hours: Reviewed  PE: Abd: soft, non-tender, +bs, incisions well healed  Lab Results:  No results found for this basename: WBC:2,HGB:2,HCT:2,PLT:2 in the last 72 hours BMET No results found for this basename: NA:2,K:2,CL:2,CO2:2,GLUCOSE:2,BUN:2,CREATININE:2,CALCIUM:2 in the last 72 hours PT/INR No results found for this basename: LABPROT:2,INR:2 in the last 72 hours CMP     Component Value Date/Time   NA 136 04/04/2012 0926   K 3.3* 04/04/2012 0926   CL 104 04/04/2012 0926   CO2 22 04/04/2012 0926   GLUCOSE 115* 04/04/2012 0926   BUN 7 04/04/2012 0926   CREATININE 0.70 04/04/2012 0926   CALCIUM 8.6 04/04/2012 0926   PROT 6.2 04/04/2012 0926   ALBUMIN 3.0* 04/04/2012 0926   AST 12 04/04/2012 0926   ALT 18 04/04/2012 0926   ALKPHOS 70 04/04/2012 0926   BILITOT 0.2* 04/04/2012 0926   GFRNONAA >90 04/04/2012 0926   GFRAA >90 04/04/2012 0926   Lipase     Component Value Date/Time   LIPASE 27 04/04/2012 0926       Studies/Results: No results found.  Anti-infectives: Anti-infectives    None       Assessment/Plan  1.  S/P Laparoscopic Appendectomy: doing well, may resume regular activity without restrictions, Pt will follow up with [name] PRN and knows to call with questions or concerns.     Barba Solt 04/14/2012

## 2012-04-27 ENCOUNTER — Inpatient Hospital Stay (HOSPITAL_BASED_OUTPATIENT_CLINIC_OR_DEPARTMENT_OTHER)
Admission: EM | Admit: 2012-04-27 | Discharge: 2012-05-01 | DRG: 463 | Disposition: A | Discharge status: Home / Self Care | Payer: BC Managed Care – PPO | Attending: Surgery | Admitting: Surgery

## 2012-04-27 ENCOUNTER — Emergency Department (HOSPITAL_BASED_OUTPATIENT_CLINIC_OR_DEPARTMENT_OTHER): Payer: BC Managed Care – PPO

## 2012-04-27 ENCOUNTER — Encounter (HOSPITAL_BASED_OUTPATIENT_CLINIC_OR_DEPARTMENT_OTHER): Payer: Self-pay | Admitting: *Deleted

## 2012-04-27 DIAGNOSIS — F17200 Nicotine dependence, unspecified, uncomplicated: Secondary | ICD-10-CM | POA: Diagnosis present

## 2012-04-27 DIAGNOSIS — T819XXA Unspecified complication of procedure, initial encounter: Secondary | ICD-10-CM

## 2012-04-27 DIAGNOSIS — G8918 Other acute postprocedural pain: Principal | ICD-10-CM | POA: Diagnosis present

## 2012-04-27 DIAGNOSIS — Z9089 Acquired absence of other organs: Secondary | ICD-10-CM

## 2012-04-27 DIAGNOSIS — K651 Peritoneal abscess: Secondary | ICD-10-CM | POA: Diagnosis present

## 2012-04-27 DIAGNOSIS — R509 Fever, unspecified: Secondary | ICD-10-CM | POA: Diagnosis present

## 2012-04-27 LAB — COMPREHENSIVE METABOLIC PANEL
BUN: 5 mg/dL — ABNORMAL LOW (ref 6–23)
CO2: 20 mEq/L (ref 19–32)
Calcium: 9 mg/dL (ref 8.4–10.5)
Chloride: 107 mEq/L (ref 96–112)
Creatinine, Ser: 0.7 mg/dL (ref 0.50–1.10)
GFR calc Af Amer: >90 mL/min (ref >90)
GFR calc non Af Amer: >90 mL/min (ref >90)
Glucose, Bld: 112 mg/dL — ABNORMAL HIGH (ref 70–99)
Total Bilirubin: 0.1 mg/dL — ABNORMAL LOW (ref 0.3–1.2)

## 2012-04-27 LAB — CBC WITH DIFFERENTIAL/PLATELET
Basophils Absolute: 0 10*3/uL (ref 0.0–0.1)
Eosinophils Relative: 1 % (ref 0–5)
HCT: 34.9 % — ABNORMAL LOW (ref 36.0–46.0)
Hemoglobin: 11.7 g/dL — ABNORMAL LOW (ref 12.0–15.0)
Lymphocytes Relative: 15 % (ref 12–46)
MCV: 87.5 fL (ref 78.0–100.0)
Monocytes Absolute: 0.8 10*3/uL (ref 0.1–1.0)
Monocytes Relative: 7 % (ref 3–12)
RDW: 13.9 % (ref 11.5–15.5)
WBC: 11.6 10*3/uL — ABNORMAL HIGH (ref 4.0–10.5)

## 2012-04-27 LAB — URINALYSIS, ROUTINE W REFLEX MICROSCOPIC
Nitrite: NEGATIVE
Protein, ur: NEGATIVE mg/dL
Specific Gravity, Urine: 1.006 (ref 1.005–1.030)
Urobilinogen, UA: 0.2 mg/dL (ref 0.0–1.0)

## 2012-04-27 LAB — PREGNANCY, URINE: Preg Test, Ur: NEGATIVE

## 2012-04-27 MED ORDER — ONDANSETRON HCL 4 MG/2ML IJ SOLN
4.0000 mg | Freq: Once | INTRAMUSCULAR | Status: AC
  Administered 2012-04-27: 4 mg via INTRAVENOUS

## 2012-04-27 MED ORDER — ONDANSETRON HCL 4 MG/2ML IJ SOLN
INTRAMUSCULAR | Status: AC
  Administered 2012-04-27: 4 mg via INTRAVENOUS
  Filled 2012-04-27: qty 2

## 2012-04-27 MED ORDER — METRONIDAZOLE IN NACL 5-0.79 MG/ML-% IV SOLN
500.0000 mg | Freq: Once | INTRAVENOUS | Status: AC
  Administered 2012-04-27: 500 mg via INTRAVENOUS
  Filled 2012-04-27: qty 100

## 2012-04-27 MED ORDER — CIPROFLOXACIN IN D5W 400 MG/200ML IV SOLN
400.0000 mg | Freq: Once | INTRAVENOUS | Status: AC
  Administered 2012-04-27: 400 mg via INTRAVENOUS
  Filled 2012-04-27: qty 200

## 2012-04-27 MED ORDER — IOHEXOL 300 MG/ML  SOLN
50.0000 mL | Freq: Once | INTRAMUSCULAR | Status: AC | PRN
  Administered 2012-04-27: 50 mL via ORAL

## 2012-04-27 MED ORDER — IOHEXOL 300 MG/ML  SOLN
100.0000 mL | Freq: Once | INTRAMUSCULAR | Status: AC | PRN
  Administered 2012-04-27: 100 mL via INTRAVENOUS

## 2012-04-27 MED ORDER — SODIUM CHLORIDE 0.9 % IV SOLN
Freq: Once | INTRAVENOUS | Status: AC
Start: 2012-04-27 — End: 2012-04-27
  Administered 2012-04-27: 20:00:00 via INTRAVENOUS

## 2012-04-27 NOTE — ED Provider Notes (Signed)
History     CSN: [id number]  Arrival date & time 04/27/12  1458   First MD Initiated Contact with Patient 04/27/12 1507      Chief Complaint  Patient presents with  . Abdominal Pain    (Consider location/radiation/quality/duration/timing/severity/associated sxs/prior treatment) HPI Comments: Patient presents with c/o abd pain x 2 days, fever and vomiting today. Pt had laparotomy for ruptured appendicitis 10/7 by Dr. Corliss Skains. Surgery was uncomplicated. Patient had return ED visit and CT 10/12 showing: small postoperative fluid collection or abscess in the surgical bed in the right lower quadrant measuring 2.5 x 3.9 cm. She has WBC count of 10k at that time. Patient improved and has had no problems until 2 days ago. Fever to 102F at home. Vomiting is non-bloody, non-bilious. She had normal BM this AM. No urinary sx or vaginal dc. No treatments other than motrin PTA. The onset of this condition was gradual. The course is constant. Aggravating factors: palpation. Alleviating factors: none.     Patient is a 36 y.o. female presenting with abdominal pain. The history is provided by the patient.  Abdominal Pain The primary symptoms of the illness include abdominal pain, fever, nausea and vomiting. The primary symptoms of the illness do not include diarrhea or dysuria.    Past Medical History  Diagnosis Date  . No pertinent past medical history   . Arthritis     Past Surgical History  Procedure Date  . Laparoscopic appendectomy 03/30/2012    Procedure: APPENDECTOMY LAPAROSCOPIC;  Surgeon: Wilmon Arms. Corliss Skains, MD;  Location: [facility];  Service: General;  Laterality: N/A;  . Cesarean section 12/26/06  . Cholecystectomy 03/2004  . Appendectomy 03/2012    Family History  Problem Relation Age of Onset  . Cancer Mother     breast  . Cancer Sister     breast  . Cancer Paternal Grandfather     lung    History  Substance Use Topics  . Smoking status: Current Every Day Smoker -- 0.5 packs/day  for 17 years    Types: Cigarettes  . Smokeless tobacco: Never Used     Comment: 6 cigs per day  . Alcohol Use: No    OB History    Grav Para Term Preterm Abortions TAB SAB Ect Mult Living                  Review of Systems  Constitutional: Positive for fever.  HENT: Negative for sore throat and rhinorrhea.   Eyes: Negative for redness.  Respiratory: Negative for cough.   Cardiovascular: Negative for chest pain.  Gastrointestinal: Positive for nausea, vomiting and abdominal pain. Negative for diarrhea.  Genitourinary: Negative for dysuria.  Musculoskeletal: Negative for myalgias.  Skin: Negative for rash.  Neurological: Negative for headaches.    Allergies  Penicillins and Iodine  Home Medications   Current Outpatient Rx  Name  Route  Sig  Dispense  Refill  . IBUPROFEN 200 MG PO TABS   Oral   Take 400 mg by mouth every 6 (six) hours as needed. Pain.           BP 134/72  Pulse 103  Temp 99 F (37.2 C) (Oral)  Ht 5\' 5"  (1.651 m)  Wt 208 lb (94.348 kg)  BMI 34.61 kg/m2  SpO2 99%  LMP 03/11/2012  Physical Exam  Nursing note and vitals reviewed. Constitutional: She appears well-developed and well-nourished.  HENT:  Head: Normocephalic and atraumatic.  Eyes: Conjunctivae normal are normal. Right eye exhibits  no discharge. Left eye exhibits no discharge.  Neck: Normal range of motion. Neck supple.  Cardiovascular: Normal rate, regular rhythm and normal heart sounds.   Pulmonary/Chest: Effort normal and breath sounds normal.  Abdominal: Soft. There is tenderness in the periumbilical area. There is no rebound, no guarding, no tenderness at McBurney's point and negative Murphy's sign.    Neurological: She is alert.  Skin: Skin is warm and dry.  Psychiatric: She has a normal mood and affect.    ED Course  Procedures (including critical care time)  Labs Reviewed  CBC WITH DIFFERENTIAL - Abnormal; Notable for the following:    WBC 11.6 (*)     Hemoglobin  11.7 (*)     HCT 34.9 (*)     Neutro Abs 8.9 (*)     All other components within normal limits  COMPREHENSIVE METABOLIC PANEL - Abnormal; Notable for the following:    Glucose, Bld 112 (*)     BUN 5 (*)     Albumin 3.3 (*)     Total Bilirubin 0.1 (*)     All other components within normal limits  URINALYSIS, ROUTINE W REFLEX MICROSCOPIC - Abnormal; Notable for the following:    APPearance CLOUDY (*)     All other components within normal limits  PREGNANCY, URINE   Ct Abdomen Pelvis W Contrast  04/27/2012  *RADIOLOGY REPORT*  Clinical Data: Periumbilical abdominal pain.  Fever.  Prior appendectomy 1 month ago.  Vomiting.  CT ABDOMEN AND PELVIS WITH CONTRAST  Technique:  Multidetector CT imaging of the abdomen and pelvis was performed following the standard protocol during bolus administration of intravenous contrast.  Contrast: OMNIPAQUE IOHEXOL 300 MG/ML  SOLN  Comparison: 04/04/2012  Findings: The liver, spleen, pancreas, and adrenal glands appear unremarkable.  Gallbladder surgically absent.  3 mm left kidney upper pole nonobstructive renal calculus observed.  No hydronephrosis or hydroureter.  Orally administered contrast extends through to the cecum. Terminal ileum unremarkable.  There are air-fluid levels in nondilated distal loops of small bowel in addition to abnormal extraluminal confluent stranding posterior to the cecum which it extends towards the right adnexa.  There appears to be a tiny locule of extraluminal gas in this vicinity, along with a faint linear collection of increased extraluminal density which could reflect a tiny amount of extraluminal contrast along the margin of this collection on image 64 of series 2, or possibly a suture line.  The overall appearance of localized phlegmon and potential tiny amount of extraluminal gas is relatively similar to 04/04/2012, although the amount of gas is reduced.  A small amount of free pelvic fluid is present in the cul-de-sac.  Urinary bladder unremarkable.  Pericecal lymph nodes are similar to prior.  The orally administered contrast does extend to the distal colon.  IMPRESSION:  1.  Similar appearance of extraluminal phlegmon with a tiny loculation of extraluminal gas adjacent to the cecum.  This does not have a typical abscess appearance of the well defined enhancing wall, but does have a small amount of fluid density extending along the right broad ligament and a small amount of free pelvic fluid. The appearance suggests a persistent small leak or breakdown along the cecal margin, versus localized chronic infection. 2.  Nonobstructive left nephrolithiasis.   Original Report Authenticated By: Gaylyn Rong, M.D.      1. Postoperative complication   2. Intra-abdominal abscess     3:30 PM Patient seen and examined. Previous records reviewed. Work-up initiated. Patient does  not want pain medications.   Vital signs reviewed and are as follows: Filed Vitals:   04/27/12 1509  BP: 134/72  Pulse: 103  Temp: 99 F (37.2 C)   Ct reviewed by myself. Exam remains unchanged.   Dr. Bebe Shaggy has spoken with surgery who would like patient transferred to [facility]. Cipro/flagyl ordered. Patient continues to refuse pain meds.   Dr. Anitra Lauth notified of transfer to [facility] and that surgery will need to be notified on arrival.     MDM  Admit/transfer for possible intraabdominal infection in setting of recent appendectomy.        Renne Crigler, [place] 04/27/12 [phone]

## 2012-04-27 NOTE — Plan of Care (Signed)
Problem: Phase I Progression Outcomes Goal: Initial discharge plan identified Outcome: Completed/Met Date Met:  04/27/12 Pt admitted from [facility] thru [facility] ED dept.  Pt states she has good support at home from husband and children.  Pt w/o c/o pain presently, just slight abd tenderness.

## 2012-04-27 NOTE — H&P (Signed)
Deborah Dodson is an 36 y.o. female.   Chief Complaint: ab pain/fever s/p appendectomy HPI: 6 yof who underwent lap appy almost a month ago for perforated appendicitis.  She had been doing pretty well and actually had her postop appt a couple weeks ago also.  She was having bm, tol diet and returning to normal self. This weekend she developed abd pain associated with fevers.  She is having some vomiting.  She went to er and was found to have a phlegmon on ct, elevated wbc.  She was then transferred to [facility] for evaluation where she complains mostly of lower abdominal pain.  Past Medical History  Diagnosis Date  . No pertinent past medical history   . Arthritis     Past Surgical History  Procedure Date  . Laparoscopic appendectomy 03/30/2012    Procedure: APPENDECTOMY LAPAROSCOPIC;  Surgeon: Wilmon Arms. Corliss Skains, MD;  Location: [facility];  Service: General;  Laterality: N/A;  . Cesarean section 12/26/06  . Cholecystectomy 03/2004  . Appendectomy 03/2012    Family History  Problem Relation Age of Onset  . Cancer Mother     breast  . Cancer Sister     breast  . Cancer Paternal Grandfather     lung   Social History:  reports that she has been smoking Cigarettes.  She has a 8.5 pack-year smoking history. She has never used smokeless tobacco. She reports that she does not drink alcohol or use illicit drugs.  Allergies:  Allergies  Allergen Reactions  . Penicillins Other (See Comments)    Childhood reaction  . Iodine Rash    Rash when iodine applied to skin   Not currently taking any meds  (Not in a hospital admission)  Results for orders placed during the hospital encounter of 04/27/12 (from the past 48 hour(s))  CBC WITH DIFFERENTIAL     Status: Abnormal   Collection Time   04/27/12  3:35 PM      Component Value Range Comment   WBC 11.6 (*) 4.0 - 10.5 K/uL    RBC 3.99  3.87 - 5.11 MIL/uL    Hemoglobin 11.7 (*) 12.0 - 15.0 g/dL    HCT 16.1 (*) 09.6 - 46.0 %    MCV 87.5  78.0 -  100.0 fL    MCH 29.3  26.0 - 34.0 pg    MCHC 33.5  30.0 - 36.0 g/dL    RDW 04.5  40.9 - 81.1 %    Platelets 290  150 - 400 K/uL    Neutrophils Relative 77  43 - 77 %    Neutro Abs 8.9 (*) 1.7 - 7.7 K/uL    Lymphocytes Relative 15  12 - 46 %    Lymphs Abs 1.8  0.7 - 4.0 K/uL    Monocytes Relative 7  3 - 12 %    Monocytes Absolute 0.8  0.1 - 1.0 K/uL    Eosinophils Relative 1  0 - 5 %    Eosinophils Absolute 0.1  0.0 - 0.7 K/uL    Basophils Relative 0  0 - 1 %    Basophils Absolute 0.0  0.0 - 0.1 K/uL   COMPREHENSIVE METABOLIC PANEL     Status: Abnormal   Collection Time   04/27/12  3:35 PM      Component Value Range Comment   Sodium 140  135 - 145 mEq/L    Potassium 3.5  3.5 - 5.1 mEq/L    Chloride 107  96 - 112 mEq/L  CO2 20  19 - 32 mEq/L    Glucose, Bld 112 (*) 70 - 99 mg/dL    BUN 5 (*) 6 - 23 mg/dL    Creatinine, Ser 1.61  0.50 - 1.10 mg/dL    Calcium 9.0  8.4 - 09.6 mg/dL    Total Protein 6.7  6.0 - 8.3 g/dL    Albumin 3.3 (*) 3.5 - 5.2 g/dL    AST 14  0 - 37 U/L    ALT 14  0 - 35 U/L    Alkaline Phosphatase 94  39 - 117 U/L    Total Bilirubin 0.1 (*) 0.3 - 1.2 mg/dL    GFR calc non Af Amer >90  >90 mL/min    GFR calc Af Amer >90  >90 mL/min   URINALYSIS, ROUTINE W REFLEX MICROSCOPIC     Status: Abnormal   Collection Time   04/27/12  3:54 PM      Component Value Range Comment   Color, Urine YELLOW  YELLOW    APPearance CLOUDY (*) CLEAR    Specific Gravity, Urine 1.006  1.005 - 1.030    pH 7.5  5.0 - 8.0    Glucose, UA NEGATIVE  NEGATIVE mg/dL    Hgb urine dipstick NEGATIVE  NEGATIVE    Bilirubin Urine NEGATIVE  NEGATIVE    Ketones, ur NEGATIVE  NEGATIVE mg/dL    Protein, ur NEGATIVE  NEGATIVE mg/dL    Urobilinogen, UA 0.2  0.0 - 1.0 mg/dL    Nitrite NEGATIVE  NEGATIVE    Leukocytes, UA NEGATIVE  NEGATIVE MICROSCOPIC NOT DONE ON URINES WITH NEGATIVE PROTEIN, BLOOD, LEUKOCYTES, NITRITE, OR GLUCOSE <1000 mg/dL.  PREGNANCY, URINE     Status: Normal   Collection  Time   04/27/12  3:54 PM      Component Value Range Comment   Preg Test, Ur NEGATIVE  NEGATIVE    Ct Abdomen Pelvis W Contrast  04/27/2012  *RADIOLOGY REPORT*  Clinical Data: Periumbilical abdominal pain.  Fever.  Prior appendectomy 1 month ago.  Vomiting.  CT ABDOMEN AND PELVIS WITH CONTRAST  Technique:  Multidetector CT imaging of the abdomen and pelvis was performed following the standard protocol during bolus administration of intravenous contrast.  Contrast: OMNIPAQUE IOHEXOL 300 MG/ML  SOLN  Comparison: 04/04/2012  Findings: The liver, spleen, pancreas, and adrenal glands appear unremarkable.  Gallbladder surgically absent.  3 mm left kidney upper pole nonobstructive renal calculus observed.  No hydronephrosis or hydroureter.  Orally administered contrast extends through to the cecum. Terminal ileum unremarkable.  There are air-fluid levels in nondilated distal loops of small bowel in addition to abnormal extraluminal confluent stranding posterior to the cecum which it extends towards the right adnexa.  There appears to be a tiny locule of extraluminal gas in this vicinity, along with a faint linear collection of increased extraluminal density which could reflect a tiny amount of extraluminal contrast along the margin of this collection on image 64 of series 2, or possibly a suture line.  The overall appearance of localized phlegmon and potential tiny amount of extraluminal gas is relatively similar to 04/04/2012, although the amount of gas is reduced.  A small amount of free pelvic fluid is present in the cul-de-sac. Urinary bladder unremarkable.  Pericecal lymph nodes are similar to prior.  The orally administered contrast does extend to the distal colon.  IMPRESSION:  1.  Similar appearance of extraluminal phlegmon with a tiny loculation of extraluminal gas adjacent to the cecum.  This does not have a typical abscess appearance of the well defined enhancing wall, but does have a small amount of  fluid density extending along the right broad ligament and a small amount of free pelvic fluid. The appearance suggests a persistent small leak or breakdown along the cecal margin, versus localized chronic infection. 2.  Nonobstructive left nephrolithiasis.   Original Report Authenticated By: Gaylyn Rong, M.D.     Review of Systems  Constitutional: Positive for fever and chills.  Gastrointestinal: Positive for nausea, vomiting and abdominal pain. Negative for diarrhea and constipation.  Neurological: Positive for weakness.    Blood pressure 110/54, pulse 87, temperature 98.8 F (37.1 C), temperature source Oral, resp. rate 18, height 5\' 5"  (1.651 m), weight 208 lb (94.348 kg), last menstrual period 04/22/2012, SpO2 98.00%. Physical Exam  Vitals reviewed. Constitutional: She appears well-developed and well-nourished.  Eyes: No scleral icterus.  Cardiovascular: Normal rate and regular rhythm.   Respiratory: Effort normal and breath sounds normal. She has no wheezes. She has no rales.  GI: Soft. Bowel sounds are normal. There is tenderness in the right lower quadrant and left lower quadrant. There is no rigidity and no guarding. No hernia.    Lymphadenopathy:    She has no cervical adenopathy.     Assessment/Plan RLQ phlegmon, ? Cecal fistula  She is nontoxic right now and will plan on making npo with iv abx.  Hopefully this will resolve with that.  We discussed possible abscess formation requiring drain and repeat ct scan if she does not improve.  We discussed possible reoperation for persistent leak but I think this would be unlikely.    Nazaria Riesen 04/27/2012, 9:49 PM

## 2012-04-27 NOTE — ED Provider Notes (Signed)
D/w dr Dwain Sarna, we discussed CT results He requests cipro/flagyll and transfer to [facility] and he will see in ER and admit   Joya Gaskins, MD 04/27/12 [phone]

## 2012-04-27 NOTE — ED Notes (Signed)
Pt. Reports mild abd. Pain   5/10 and reports she does not need any pain meds.  Pt. Has no nausea noted.

## 2012-04-27 NOTE — ED Provider Notes (Signed)
Medical screening examination/treatment/procedure(s) were conducted as a shared visit with non-physician practitioner(s) and myself.  I personally evaluated the patient during the encounter   Joya Gaskins, MD 04/27/12 2136

## 2012-04-27 NOTE — ED Notes (Signed)
Informed Care Link of Pt. Having itching at the IV site with the CIPRO.  Stopped the med and flushed site.  Started Flagyl with no complications.  Pt. Is in no resp. Distress and has no break out noted.     Pt. Is speaking clear conscise speech and has no edema noted at IV site or mouth area.  Clear Lung snds bilat.  Care Link at bed side.

## 2012-04-27 NOTE — ED Notes (Signed)
Pt c/o lower abd pain x 2 days, fever and vomiting x 1 day, appendectomy x 3 weeks ago.

## 2012-04-27 NOTE — ED Notes (Signed)
Stopped Cipro due to Pt. C/o topical itching at IV site.  Flushed site and stopped med.  Will inform EDP and start Flagyl.

## 2012-04-28 LAB — CBC
HCT: 36.7 % (ref 36.0–46.0)
Hemoglobin: 12 g/dL (ref 12.0–15.0)
MCHC: 32.7 g/dL (ref 30.0–36.0)
MCV: 88.4 fL (ref 78.0–100.0)
RDW: 14.2 % (ref 11.5–15.5)

## 2012-04-28 LAB — BASIC METABOLIC PANEL
BUN: 5 mg/dL — ABNORMAL LOW (ref 6–23)
CO2: 23 mEq/L (ref 19–32)
Chloride: 107 mEq/L (ref 96–112)
Creatinine, Ser: 0.74 mg/dL (ref 0.50–1.10)
Potassium: 3.5 mEq/L (ref 3.5–5.1)

## 2012-04-28 MED ORDER — ACETAMINOPHEN 650 MG RE SUPP: 650.0000 mg | Freq: Four times a day (QID) | RECTAL | Status: DC | PRN

## 2012-04-28 MED ORDER — METRONIDAZOLE IN NACL 5-0.79 MG/ML-% IV SOLN
500.0000 mg | Freq: Three times a day (TID) | INTRAVENOUS | Status: DC
  Filled 2012-04-28: qty 100

## 2012-04-28 MED ORDER — SODIUM CHLORIDE 0.9 % IV SOLN
1.0000 g | Freq: Every day | INTRAVENOUS | Status: DC
  Administered 2012-04-28 – 2012-04-30 (×4): 1 g via INTRAVENOUS
  Filled 2012-04-28 (×4): qty 1

## 2012-04-28 MED ORDER — PANTOPRAZOLE SODIUM 40 MG IV SOLR
40.0000 mg | Freq: Every day | INTRAVENOUS | Status: DC
  Administered 2012-04-28 – 2012-04-30 (×3): 40 mg via INTRAVENOUS
  Filled 2012-04-28 (×5): qty 40

## 2012-04-28 MED ORDER — ONDANSETRON HCL 4 MG/2ML IJ SOLN: 4.0000 mg | Freq: Four times a day (QID) | INTRAMUSCULAR | Status: DC | PRN

## 2012-04-28 MED ORDER — SODIUM CHLORIDE 0.9 % IV SOLN
INTRAVENOUS | Status: DC
  Administered 2012-04-28 – 2012-04-30 (×6): via INTRAVENOUS

## 2012-04-28 MED ORDER — ACETAMINOPHEN 325 MG PO TABS
650.0000 mg | ORAL_TABLET | Freq: Four times a day (QID) | ORAL | Status: DC | PRN
  Administered 2012-04-28 – 2012-04-30 (×3): 650 mg via ORAL
  Filled 2012-04-28 (×3): qty 2

## 2012-04-28 MED ORDER — CIPROFLOXACIN IN D5W 400 MG/200ML IV SOLN: 400.0000 mg | Freq: Two times a day (BID) | INTRAVENOUS | Status: DC

## 2012-04-28 MED ORDER — ENOXAPARIN SODIUM 40 MG/0.4ML ~~LOC~~ SOLN
40.0000 mg | SUBCUTANEOUS | Status: DC
  Administered 2012-04-28 – 2012-05-01 (×4): 40 mg via SUBCUTANEOUS
  Filled 2012-04-28 (×5): qty 0.4

## 2012-04-28 MED ORDER — MORPHINE SULFATE 2 MG/ML IJ SOLN: 2.0000 mg | INTRAMUSCULAR | Status: DC | PRN

## 2012-04-28 NOTE — Progress Notes (Signed)
Pt informed staff that she was allergic to Cipro- she stated that she had immediate itching after it was hung at [facility].  Cipro added as an allergy.

## 2012-04-28 NOTE — Progress Notes (Signed)
General Surgery [facility]  Patient seen and examined.  Husband at bedside.  Patient with less pain, tolerating clear liquids.  Continue IV Invanz.  Velora Heckler, MD, [facility] Office: [phone]

## 2012-04-28 NOTE — Progress Notes (Signed)
Subjective:  Feels much better this morning, remains tender in RLQ to palpation, but this is improved per the patients report. Denies any N/V.   Objective: Vital signs in last 24 hours: Temp:  [98.2 F (36.8 C)-99 F (37.2 C)] 98.4 F (36.9 C) (11/05 0606) Pulse Rate:  [74-103] 83  (11/05 0606) Resp:  [14-18] 16  (11/05 0606) BP: (100-134)/(54-73) 117/72 mmHg (11/05 0606) SpO2:  [94 %-99 %] 95 % (11/05 0606) Weight:  [208 lb (94.348 kg)] 208 lb (94.348 kg) (11/04 1507) Last BM Date: 04/27/12  Intake/Output from previous day: 11/04 0701 - 11/05 0700 In: 1148.3 [I.V.:1148.3] Out: 300 [Urine:300] Intake/Output this shift:    General appearance: alert, cooperative, appears stated age, no distress and moderately obese Chest: CTA Cardiac: RRR Abdomen: obese, soft, tender in RLQ (improved per patient) + BS, no Flatus or BM (has been NPO) CT of abdomen and pelvis done 04/27/12: IMPRESSION:  1. Similar appearance of extraluminal phlegmon with a tiny  loculation of extraluminal gas adjacent to the cecum. This does  not have a typical abscess appearance of the well defined enhancing  wall, but does have a small amount of fluid density extending along  the right broad ligament and a small amount of free pelvic fluid.  The appearance suggests a persistent small leak or breakdown along  the cecal margin, versus localized chronic infection.  2. Nonobstructive left nephrolithiasis.  Original Report Authenticated By: Gaylyn Rong, M.D Extremities: no edema or tenderness, + pulses bilaterally, warm to touch. Labs: Wbc wnl, H&H Plts stable, BUN and Creat unchanged.  Lab Results:   Basename 04/28/12 0441 04/27/12 1535  WBC 10.0 11.6*  HGB 12.0 11.7*  HCT 36.7 34.9*  PLT 263 290   BMET  Basename 04/28/12 0441 04/27/12 1535  NA 139 140  K 3.5 3.5  CL 107 107  CO2 23 20  GLUCOSE 90 112*  BUN 5* 5*  CREATININE 0.74 0.70  CALCIUM 8.6 9.0   PT/INR No results found for  this basename: LABPROT:2,INR:2 in the last 72 hours ABG No results found for this basename: PHART:2,PCO2:2,PO2:2,HCO3:2 in the last 72 hours  Studies/Results: Ct Abdomen Pelvis W Contrast  04/27/2012  *RADIOLOGY REPORT*  Clinical Data: Periumbilical abdominal pain.  Fever.  Prior appendectomy 1 month ago.  Vomiting.  CT ABDOMEN AND PELVIS WITH CONTRAST  Technique:  Multidetector CT imaging of the abdomen and pelvis was performed following the standard protocol during bolus administration of intravenous contrast.  Contrast: OMNIPAQUE IOHEXOL 300 MG/ML  SOLN  Comparison: 04/04/2012  Findings: The liver, spleen, pancreas, and adrenal glands appear unremarkable.  Gallbladder surgically absent.  3 mm left kidney upper pole nonobstructive renal calculus observed.  No hydronephrosis or hydroureter.  Orally administered contrast extends through to the cecum. Terminal ileum unremarkable.  There are air-fluid levels in nondilated distal loops of small bowel in addition to abnormal extraluminal confluent stranding posterior to the cecum which it extends towards the right adnexa.  There appears to be a tiny locule of extraluminal gas in this vicinity, along with a faint linear collection of increased extraluminal density which could reflect a tiny amount of extraluminal contrast along the margin of this collection on image 64 of series 2, or possibly a suture line.  The overall appearance of localized phlegmon and potential tiny amount of extraluminal gas is relatively similar to 04/04/2012, although the amount of gas is reduced.  A small amount of free pelvic fluid is present in the cul-de-sac. Urinary bladder  unremarkable.  Pericecal lymph nodes are similar to prior.  The orally administered contrast does extend to the distal colon.  IMPRESSION:  1.  Similar appearance of extraluminal phlegmon with a tiny loculation of extraluminal gas adjacent to the cecum.  This does not have a typical abscess appearance of the  well defined enhancing wall, but does have a small amount of fluid density extending along the right broad ligament and a small amount of free pelvic fluid. The appearance suggests a persistent small leak or breakdown along the cecal margin, versus localized chronic infection. 2.  Nonobstructive left nephrolithiasis.   Original Report Authenticated By: Gaylyn Rong, M.D.     Anti-infectives: Anti-infectives     Start     Dose/Rate Route Frequency Ordered Stop   04/28/12 0600   metroNIDAZOLE (FLAGYL) IVPB 500 mg  Status:  Discontinued        500 mg 100 mL/hr over 60 Minutes Intravenous 3 times per day 04/28/12 0248 04/28/12 0300   04/28/12 0315   ertapenem (INVANZ) 1 g in sodium chloride 0.9 % 50 mL IVPB        1 g 100 mL/hr over 30 Minutes Intravenous Daily at bedtime 04/28/12 0300     04/28/12 0247   ciprofloxacin (CIPRO) IVPB 400 mg  Status:  Discontinued        400 mg 200 mL/hr over 60 Minutes Intravenous Every 12 hours 04/28/12 0248 04/28/12 0259   04/27/12 1945   ciprofloxacin (CIPRO) IVPB 400 mg        400 mg 200 mL/hr over 60 Minutes Intravenous  Once 04/27/12 1931 04/27/12 1959   04/27/12 1945   metroNIDAZOLE (FLAGYL) IVPB 500 mg        500 mg 100 mL/hr over 60 Minutes Intravenous  Once 04/27/12 1931 04/27/12 2216          Assessment/Plan: s/p appendectomy 1. Continue IVF and ABX 2. Will advance to clears 3. OOB/Ambulate 4. Continue to monitor clinical picture.  If she does not improve she may require repeat ct scan and possible perc drain.   LOS: 1 day    Blenda Mounts [facility] Pager # [phone]  04/28/2012

## 2012-04-28 NOTE — Care Management Note (Signed)
    Page 1 of 1   04/28/2012     1:37:06 PM   CARE MANAGEMENT NOTE 04/28/2012  Patient:  Deborah Dodson, Deborah Dodson   Account Number:  [id number]  Date Initiated:  04/28/2012  Documentation initiated by:  Lorenda Ishihara  Subjective/Objective Assessment:   36 yo female admitted with RLQ phlegmon, ? Cecal fistula, s/p lap appy 1 month previous. PTA lived at home with spouse.     Action/Plan:   home when stable   Anticipated DC Date:  04/30/2012   Anticipated DC Plan:  HOME/SELF CARE      DC Planning Services  CM consult      Choice offered to / List presented to:             Status of service:  Completed, signed off Medicare Important Message given?   (If response is "NO", the following Medicare IM given date fields will be blank) Date Medicare IM given:   Date Additional Medicare IM given:    Discharge Disposition:  HOME/SELF CARE  Per UR Regulation:  Reviewed for med. necessity/level of care/duration of stay  If discussed at Long Length of Stay Meetings, dates discussed:    Comments:

## 2012-04-28 NOTE — Progress Notes (Signed)
INITIAL ADULT NUTRITION ASSESSMENT Date: 04/28/2012   Time: 10:27 AM Reason for Assessment: Nutrition risk   INTERVENTION: Diet advancement per MD. Will monitor.   Pt meets criteria for severe malnutrition of chronic illness AEB <75% estimated energy intake with 8.7% weight loss in the past month per pt report.   ASSESSMENT: Female 36 y.o.  Dx: Abdominal pain, fever, s/p appendectomy  Food/Nutrition Related Hx: Pt with recent appendectomy 1 month ago and reports 20 pound unintended weight loss since then r/t eating less within 2 weeks after surgery. Since then pt was starting to eat regular meals, however developed abdominal pain for 2 days and vomiting for 1 day. Pt denies any nausea/vomiting today and is eager to have diet advanced because she is hungry.   Hx:  Past Medical History  Diagnosis Date  . No pertinent past medical history   . Arthritis    Related Meds:  Scheduled Meds:   . [COMPLETED] sodium chloride   Intravenous Once  . [COMPLETED] ciprofloxacin  400 mg Intravenous Once  . enoxaparin  40 mg Subcutaneous Q24H  . ertapenem  1 g Intravenous QHS  . [COMPLETED] metronidazole  500 mg Intravenous Once  . [COMPLETED] ondansetron (ZOFRAN) IV  4 mg Intravenous Once  . pantoprazole (PROTONIX) IV  40 mg Intravenous QHS  . [DISCONTINUED] ciprofloxacin  400 mg Intravenous Q12H  . [DISCONTINUED] metronidazole  500 mg Intravenous Q8H   Continuous Infusions:   . sodium chloride 100 mL/hr at 04/28/12 0641   PRN Meds:.acetaminophen, acetaminophen, [COMPLETED] iohexol, [COMPLETED] iohexol, morphine injection, ondansetron  Ht: 5\' 5"  (165.1 cm)  Wt: 208 lb (94.348 kg)  Ideal Wt: 125 lb % Ideal Wt: 166  Usual Wt: 228 lb % Usual Wt: 91   Body mass index is 34.61 kg/(m^2). Class I obesity   Labs:  CMP     Component Value Date/Time   NA 139 04/28/2012 0441   K 3.5 04/28/2012 0441   CL 107 04/28/2012 0441   CO2 23 04/28/2012 0441   GLUCOSE 90 04/28/2012 0441   BUN 5*  04/28/2012 0441   CREATININE 0.74 04/28/2012 0441   CALCIUM 8.6 04/28/2012 0441   PROT 6.7 04/27/2012 1535   ALBUMIN 3.3* 04/27/2012 1535   AST 14 04/27/2012 1535   ALT 14 04/27/2012 1535   ALKPHOS 94 04/27/2012 1535   BILITOT 0.1* 04/27/2012 1535   GFRNONAA >90 04/28/2012 0441   GFRAA >90 04/28/2012 0441    Intake/Output Summary (Last 24 hours) at 04/28/12 1038 Last data filed at 04/28/12 1032  Gross per 24 hour  Intake 1148.33 ml  Output    600 ml  Net 548.33 ml   Last BM - 11/4  Diet Order: NPO   IVF:    sodium chloride Last Rate: 100 mL/hr at 04/28/12 0641    Estimated Nutritional Needs:   Kcal:1650-1900 Protein:55-70g Fluid:1.6-1.9L  NUTRITION DIAGNOSIS: -Inadequate oral intake (NI-2.1).  Status: Ongoing  RELATED TO: inability to eat  AS EVIDENCE BY: NPO  MONITORING/EVALUATION(Goals): Advance diet as tolerated to regular diet.   EDUCATION NEEDS: -No education needs identified at this time  Dietitian #: [phone]  DOCUMENTATION CODES Per approved criteria  -Severe malnutrition in the context of chronic illness -Obesity Unspecified    Marshall Cork 04/28/2012, 10:27 AM

## 2012-04-29 LAB — CBC
Hemoglobin: 12.1 g/dL (ref 12.0–15.0)
MCH: 29.5 pg (ref 26.0–34.0)
MCHC: 34.1 g/dL (ref 30.0–36.0)
MCV: 86.6 fL (ref 78.0–100.0)
Platelets: 270 10*3/uL (ref 150–400)

## 2012-04-29 NOTE — Progress Notes (Signed)
Patient ID: Deborah Dodson, female   DOB: July 10, 1975, 36 y.o.   MRN: [id number]    Subjective:  Feels much better this morning, remains tender in RLQ to palpation, but this is improved per the patients report. Denies any N/V. Tolerating clears well.  Objective: Vital signs in last 24 hours: Temp:  [98.1 F (36.7 C)-98.4 F (36.9 C)] 98.3 F (36.8 C) (11/06 0535) Pulse Rate:  [75-98] 76  (11/06 0535) Resp:  [18-20] 18  (11/06 0535) BP: (108-119)/(46-76) 113/76 mmHg (11/06 0535) SpO2:  [94 %-98 %] 96 % (11/06 0535) Last BM Date: 04/27/12  Intake/Output from previous day: 11/05 0701 - 11/06 0700 In: 3221.7 [P.O.:780; I.V.:2331.7; IV Piggyback:110] Out: 3750 [Urine:3750] Intake/Output this shift:    General appearance: alert, cooperative, appears stated age, no distress and moderately obese Chest: CTA Cardiac: RRR Abdomen: obese, soft,less tender today + BS,+ Flatus. Extremities: no edema or tenderness, + pulses bilaterally, warm to touch. Lab Results:   Basename 04/28/12 0441 04/27/12 1535  WBC 10.0 11.6*  HGB 12.0 11.7*  HCT 36.7 34.9*  PLT 263 290   BMET  Basename 04/28/12 0441 04/27/12 1535  NA 139 140  K 3.5 3.5  CL 107 107  CO2 23 20  GLUCOSE 90 112*  BUN 5* 5*  CREATININE 0.74 0.70  CALCIUM 8.6 9.0   PT/INR No results found for this basename: LABPROT:2,INR:2 in the last 72 hours ABG No results found for this basename: PHART:2,PCO2:2,PO2:2,HCO3:2 in the last 72 hours  Studies/Results: Ct Abdomen Pelvis W Contrast  04/27/2012  *RADIOLOGY REPORT*  Clinical Data: Periumbilical abdominal pain.  Fever.  Prior appendectomy 1 month ago.  Vomiting.  CT ABDOMEN AND PELVIS WITH CONTRAST  Technique:  Multidetector CT imaging of the abdomen and pelvis was performed following the standard protocol during bolus administration of intravenous contrast.  Contrast: OMNIPAQUE IOHEXOL 300 MG/ML  SOLN  Comparison: 04/04/2012  Findings: The liver, spleen, pancreas, and  adrenal glands appear unremarkable.  Gallbladder surgically absent.  3 mm left kidney upper pole nonobstructive renal calculus observed.  No hydronephrosis or hydroureter.  Orally administered contrast extends through to the cecum. Terminal ileum unremarkable.  There are air-fluid levels in nondilated distal loops of small bowel in addition to abnormal extraluminal confluent stranding posterior to the cecum which it extends towards the right adnexa.  There appears to be a tiny locule of extraluminal gas in this vicinity, along with a faint linear collection of increased extraluminal density which could reflect a tiny amount of extraluminal contrast along the margin of this collection on image 64 of series 2, or possibly a suture line.  The overall appearance of localized phlegmon and potential tiny amount of extraluminal gas is relatively similar to 04/04/2012, although the amount of gas is reduced.  A small amount of free pelvic fluid is present in the cul-de-sac. Urinary bladder unremarkable.  Pericecal lymph nodes are similar to prior.  The orally administered contrast does extend to the distal colon.  IMPRESSION:  1.  Similar appearance of extraluminal phlegmon with a tiny loculation of extraluminal gas adjacent to the cecum.  This does not have a typical abscess appearance of the well defined enhancing wall, but does have a small amount of fluid density extending along the right broad ligament and a small amount of free pelvic fluid. The appearance suggests a persistent small leak or breakdown along the cecal margin, versus localized chronic infection. 2.  Nonobstructive left nephrolithiasis.   Original Report Authenticated By: Gaylyn Rong,  M.D.     Anti-infectives: Anti-infectives     Start     Dose/Rate Route Frequency Ordered Stop   04/28/12 0600   metroNIDAZOLE (FLAGYL) IVPB 500 mg  Status:  Discontinued        500 mg 100 mL/hr over 60 Minutes Intravenous 3 times per day 04/28/12 0248  04/28/12 0300   04/28/12 0315   ertapenem (INVANZ) 1 g in sodium chloride 0.9 % 50 mL IVPB        1 g 100 mL/hr over 30 Minutes Intravenous Daily at bedtime 04/28/12 0300     04/28/12 0247   ciprofloxacin (CIPRO) IVPB 400 mg  Status:  Discontinued        400 mg 200 mL/hr over 60 Minutes Intravenous Every 12 hours 04/28/12 0248 04/28/12 0259   04/27/12 1945   ciprofloxacin (CIPRO) IVPB 400 mg        400 mg 200 mL/hr over 60 Minutes Intravenous  Once 04/27/12 1931 04/27/12 1959   04/27/12 1945   metroNIDAZOLE (FLAGYL) IVPB 500 mg        500 mg 100 mL/hr over 60 Minutes Intravenous  Once 04/27/12 1931 04/27/12 2216          Assessment/Plan: s/p appendectomy 1. Continue IVF and ABX (plan to transition to po after 3 days of IV abx) 2. Will continue with clears 3. OOB/Ambulate 4. Continue to monitor clinical picture.     LOS: 2 days    Golda Acre [facility] Pager # [phone]  04/29/2012

## 2012-04-29 NOTE — Progress Notes (Signed)
General Surgery [facility]  Patient seen and examined.  WBC improved.  Mild RLQ tenderness on exam, improved.  Continue IV abx and CL diet.  Velora Heckler, MD, [facility] Office: [phone]

## 2012-04-30 LAB — CBC
HCT: 34.1 % — ABNORMAL LOW (ref 36.0–46.0)
MCHC: 34 g/dL (ref 30.0–36.0)
MCV: 86.3 fL (ref 78.0–100.0)
Platelets: 258 10*3/uL (ref 150–400)
RDW: 13.4 % (ref 11.5–15.5)
WBC: 7.5 10*3/uL (ref 4.0–10.5)

## 2012-04-30 NOTE — Progress Notes (Signed)
Patient ID: Deborah Dodson, female   DOB: 08/16/1975, 36 y.o.   MRN: [id number]    Subjective: Patient feels much better today. She denies any pain at all this morning. She has had a bowel movement. Up and walking.  Objective: Vital signs in last 24 hours: Temp:  [98.1 F (36.7 C)-99.2 F (37.3 C)] 98.1 F (36.7 C) (11/07 0602) Pulse Rate:  [61-78] 78  (11/07 0602) Resp:  [18] 18  (11/07 0602) BP: (114-133)/(66-86) 133/86 mmHg (11/07 0602) SpO2:  [96 %-98 %] 97 % (11/07 0602) Last BM Date: 04/27/12  Intake/Output from previous day: 11/06 0701 - 11/07 0700 In: 3210 [P.O.:1200; I.V.:1950; IV Piggyback:60] Out: 3625 [Urine:3625] Intake/Output this shift:    General appearance: alert and no distress GI: minimal tenderness in the right lower quadrant  Lab Results:   Basename 04/30/12 0426 04/29/12 0911  WBC 7.5 9.4  HGB 11.6* 12.1  HCT 34.1* 35.5*  PLT 258 270   BMET  Basename 04/28/12 0441 04/27/12 1535  NA 139 140  K 3.5 3.5  CL 107 107  CO2 23 20  GLUCOSE 90 112*  BUN 5* 5*  CREATININE 0.74 0.70  CALCIUM 8.6 9.0     Studies/Results: No results found.  Anti-infectives: Anti-infectives     Start     Dose/Rate Route Frequency Ordered Stop   04/28/12 0600   metroNIDAZOLE (FLAGYL) IVPB 500 mg  Status:  Discontinued        500 mg 100 mL/hr over 60 Minutes Intravenous 3 times per day 04/28/12 0248 04/28/12 0300   04/28/12 0315   ertapenem (INVANZ) 1 g in sodium chloride 0.9 % 50 mL IVPB        1 g 100 mL/hr over 30 Minutes Intravenous Daily at bedtime 04/28/12 0300     04/28/12 0247   ciprofloxacin (CIPRO) IVPB 400 mg  Status:  Discontinued        400 mg 200 mL/hr over 60 Minutes Intravenous Every 12 hours 04/28/12 0248 04/28/12 0259   04/27/12 1945   ciprofloxacin (CIPRO) IVPB 400 mg        400 mg 200 mL/hr over 60 Minutes Intravenous  Once 04/27/12 1931 04/27/12 1959   04/27/12 1945   metroNIDAZOLE (FLAGYL) IVPB 500 mg        500 mg 100 mL/hr over  60 Minutes Intravenous  Once 04/27/12 1931 04/27/12 2216          Assessment/Plan: Pericecal phlegmon post laparoscopic appendectomy. She is clinically much improved with no fever or elevated white count, no pain and minimal tenderness. Will advance to full liquid diet. Continue IV antibiotics today. She can probably go home soon and complete a course of oral antibiotics.    LOS: 3 days    Dalaya Suppa T 04/30/2012  lkj

## 2012-05-01 ENCOUNTER — Encounter (INDEPENDENT_AMBULATORY_CARE_PROVIDER_SITE_OTHER): Payer: Self-pay | Admitting: Surgery

## 2012-05-01 ENCOUNTER — Encounter (INDEPENDENT_AMBULATORY_CARE_PROVIDER_SITE_OTHER): Payer: Self-pay | Admitting: General Surgery

## 2012-05-01 ENCOUNTER — Telehealth (INDEPENDENT_AMBULATORY_CARE_PROVIDER_SITE_OTHER): Payer: Self-pay | Admitting: General Surgery

## 2012-05-01 MED ORDER — METRONIDAZOLE 500 MG PO TABS: 500.0000 mg | ORAL_TABLET | Freq: Two times a day (BID) | ORAL | Status: AC

## 2012-05-01 NOTE — Discharge Summary (Signed)
General Surgery [facility]  Agree with attached summary.  Patient to follow up with her surgeon, Dr. Marcille Blanco at [facility] office.  Velora Heckler, MD, [facility] Office: [phone]

## 2012-05-01 NOTE — Discharge Summary (Signed)
Physician Discharge Summary  Patient ID: Deborah Dodson MRN: [id number] DOB/AGE: Oct 28, 1975 36 y.o.  Admit date: 04/27/2012 Discharge date: 05/01/2012  Admission Diagnoses: abdominal pain, fever (s/p appendectomy on 03/30/12 Dr. Gwinda Passe).  Discharge Diagnoses:   There is no problem list on file for this patient.  Active Problems:  * No active Dodson problems. *    Discharged Condition: stable  Dodson Course: 36 yo wf who underwent lap appy almost a month ago for perforated appendicitis. She had been doing pretty well and actually had her postop appt a couple weeks ago also. She was having bm, tol diet and returning to normal self. This weekend she developed abd pain associated with fevers. She is having some vomiting. She went to er and was found to have a phlegmon on ct, elevated wbc. She was then transferred to Deborah Dodson for evaluation where she complains mostly of lower abdominal pain. She was admitted to our service for evaluation and treatment. During her hospitalization she was treated with conservative measures including IV ABX, NPO, IVF; eventually her condition improved to where she could return to po intake, had normal bowel function and her wbc count was wnl. She will be discharged on po abx for 10 days and has been instructed to follow up with Dr. Corliss Skains in 2 weeks time. Patient has verbalized understanding of her discharge instructions. Additionally she was given written instructions concerning same as well as our office nuimber should she need to contact Deborah Dodson prior to her appointment.  Consults: None  Significant Diagnostic Studies: labs, microbiology and radiology.  Treatments: IV hydration, antibiotics, analgesia, respiratory therapy.  Discharge Exam: Blood pressure 144/89, pulse 70, temperature 98.4 F (36.9 C), temperature source Oral, resp. rate 18, height 5\' 5"  (1.651 m), weight 208 lb (94.348 kg), last menstrual period 04/22/2012, SpO2 96.00%. General appearance: alert,  cooperative, appears stated age and no distress Abdomen: Tolerating diet, No c/o N/V or distention + BS flatus,BM Chest: CTA Cardiac: RRR Extremities: no edema or tenderness, + pulses Disposition: 01-Home or Self Care  Discharge Orders    Future Orders Please Complete By Expires   Discharge patient      Comments:   To home/self care  Follow up vist with Dr. Corliss Skains in 2 weeks time.       Medication List     As of 05/01/2012  9:10 AM    TAKE these medications         ibuprofen 200 MG tablet   Commonly known as: ADVIL,MOTRIN   Take 400 mg by mouth every 6 (six) hours as needed. Pain.      metroNIDAZOLE 500 MG tablet   Commonly known as: FLAGYL   Take 1 tablet (500 mg total) by mouth 2 (two) times daily.           Follow-up Information    Follow up with TSUEI,MATTHEW K., MD. In 2 weeks. (Call our office as needed, or  if symptoms worsen)    Contact information:   [address] [place] [address] [phone]          Signed: Blenda Mounts [facility] Pager # [phone] 05/01/2012, 9:10 AM

## 2012-05-01 NOTE — Telephone Encounter (Signed)
Pt called from home, that her pharmacist "caught" a med ordered "with at sulfa component."  She states she is allergic to Sulfa, but it was not on her Allergies list.  Added it immediately and explained that is why it was likely ordered.  Paged and updated Blenda Mounts, NP; he will check with Dr. Gerrit Friends and will call the pharmacy.

## 2013-02-07 ENCOUNTER — Emergency Department (HOSPITAL_COMMUNITY)
Admission: EM | Admit: 2013-02-07 | Discharge: 2013-02-07 | Disposition: A | Discharge status: Home / Self Care | Payer: BC Managed Care – PPO | Attending: Emergency Medicine | Admitting: Emergency Medicine

## 2013-02-07 ENCOUNTER — Emergency Department (HOSPITAL_COMMUNITY): Payer: BC Managed Care – PPO

## 2013-02-07 ENCOUNTER — Encounter (HOSPITAL_COMMUNITY): Payer: Self-pay | Admitting: Emergency Medicine

## 2013-02-07 DIAGNOSIS — Z880 Allergy status to penicillin: Secondary | ICD-10-CM | POA: Insufficient documentation

## 2013-02-07 DIAGNOSIS — N12 Tubulo-interstitial nephritis, not specified as acute or chronic: Secondary | ICD-10-CM | POA: Insufficient documentation

## 2013-02-07 DIAGNOSIS — Z3202 Encounter for pregnancy test, result negative: Secondary | ICD-10-CM | POA: Insufficient documentation

## 2013-02-07 DIAGNOSIS — F17200 Nicotine dependence, unspecified, uncomplicated: Secondary | ICD-10-CM | POA: Insufficient documentation

## 2013-02-07 DIAGNOSIS — Z8739 Personal history of other diseases of the musculoskeletal system and connective tissue: Secondary | ICD-10-CM | POA: Insufficient documentation

## 2013-02-07 DIAGNOSIS — R197 Diarrhea, unspecified: Secondary | ICD-10-CM | POA: Insufficient documentation

## 2013-02-07 DIAGNOSIS — R112 Nausea with vomiting, unspecified: Secondary | ICD-10-CM | POA: Insufficient documentation

## 2013-02-07 DIAGNOSIS — N201 Calculus of ureter: Secondary | ICD-10-CM

## 2013-02-07 LAB — CBC WITH DIFFERENTIAL/PLATELET
Basophils Absolute: 0 10*3/uL (ref 0.0–0.1)
Basophils Relative: 0 % (ref 0–1)
Eosinophils Absolute: 0.1 10*3/uL (ref 0.0–0.7)
Hemoglobin: 13.2 g/dL (ref 12.0–15.0)
MCH: 29.3 pg (ref 26.0–34.0)
MCHC: 33.9 g/dL (ref 30.0–36.0)
Monocytes Relative: 5 % (ref 3–12)
Neutro Abs: 10.1 10*3/uL — ABNORMAL HIGH (ref 1.7–7.7)
Neutrophils Relative %: 81 % — ABNORMAL HIGH (ref 43–77)
Platelets: 322 10*3/uL (ref 150–400)

## 2013-02-07 LAB — COMPREHENSIVE METABOLIC PANEL
ALT: 20 U/L (ref 0–35)
AST: 17 U/L (ref 0–37)
Albumin: 3.6 g/dL (ref 3.5–5.2)
Alkaline Phosphatase: 103 U/L (ref 39–117)
Chloride: 106 mEq/L (ref 96–112)
Potassium: 3.5 mEq/L (ref 3.5–5.1)
Sodium: 139 mEq/L (ref 135–145)
Total Bilirubin: 0.3 mg/dL (ref 0.3–1.2)
Total Protein: 6.9 g/dL (ref 6.0–8.3)

## 2013-02-07 LAB — URINALYSIS, ROUTINE W REFLEX MICROSCOPIC
Glucose, UA: NEGATIVE mg/dL
Ketones, ur: NEGATIVE mg/dL
Protein, ur: 100 mg/dL — AB
pH: 5 (ref 5.0–8.0)

## 2013-02-07 LAB — URINE MICROSCOPIC-ADD ON

## 2013-02-07 MED ORDER — IOHEXOL 300 MG/ML  SOLN
100.0000 mL | Freq: Once | INTRAMUSCULAR | Status: AC | PRN
  Administered 2013-02-07: 100 mL via INTRAVENOUS

## 2013-02-07 MED ORDER — CEPHALEXIN 500 MG PO CAPS: 500.0000 mg | ORAL_CAPSULE | Freq: Four times a day (QID) | ORAL | Status: DC

## 2013-02-07 MED ORDER — HYDROMORPHONE HCL PF 1 MG/ML IJ SOLN
1.0000 mg | Freq: Once | INTRAMUSCULAR | Status: AC
  Administered 2013-02-07: 1 mg via INTRAVENOUS
  Filled 2013-02-07: qty 1

## 2013-02-07 MED ORDER — HYDROCODONE-ACETAMINOPHEN 5-325 MG PO TABS: 1.0000 | ORAL_TABLET | Freq: Four times a day (QID) | ORAL | Status: DC | PRN

## 2013-02-07 MED ORDER — ONDANSETRON 4 MG PO TBDP: 4.0000 mg | ORAL_TABLET | Freq: Three times a day (TID) | ORAL | Status: DC | PRN

## 2013-02-07 MED ORDER — SODIUM CHLORIDE 0.9 % IV BOLUS (SEPSIS)
1000.0000 mL | Freq: Once | INTRAVENOUS | Status: AC
  Administered 2013-02-07: 1000 mL via INTRAVENOUS

## 2013-02-07 MED ORDER — ONDANSETRON HCL 4 MG/2ML IJ SOLN
4.0000 mg | Freq: Once | INTRAMUSCULAR | Status: AC
  Administered 2013-02-07: 4 mg via INTRAVENOUS
  Filled 2013-02-07: qty 2

## 2013-02-07 MED ORDER — PROMETHAZINE HCL 25 MG PO TABS: 25.0000 mg | ORAL_TABLET | Freq: Four times a day (QID) | ORAL | Status: DC | PRN

## 2013-02-07 MED ORDER — IOHEXOL 300 MG/ML  SOLN
50.0000 mL | Freq: Once | INTRAMUSCULAR | Status: AC | PRN
  Administered 2013-02-07: 50 mL via ORAL

## 2013-02-07 MED ORDER — DEXTROSE 5 % IV SOLN
1.0000 g | Freq: Once | INTRAVENOUS | Status: AC
  Administered 2013-02-07: 1 g via INTRAVENOUS
  Filled 2013-02-07: qty 10

## 2013-02-07 MED ORDER — SODIUM CHLORIDE 0.9 % IV SOLN
INTRAVENOUS | Status: DC
  Administered 2013-02-07: 14:00:00 via INTRAVENOUS

## 2013-02-07 MED ORDER — IBUPROFEN 800 MG PO TABS: 800.0000 mg | ORAL_TABLET | Freq: Three times a day (TID) | ORAL | Status: DC

## 2013-02-07 NOTE — ED Notes (Signed)
Pt alert, arrives from home, c/o llq abd pain, onset was today, denies changes in bowel or bladder, describes pain as sharp, non radiating, resp even unlabored, skin pwd

## 2013-02-07 NOTE — ED Provider Notes (Signed)
CSN: [id number]     Arrival date & time 02/07/13  1122 History     First MD Initiated Contact with Patient 02/07/13 1155     Chief Complaint  Patient presents with  . Abdominal Pain   (Consider location/radiation/quality/duration/timing/severity/associated sxs/prior Treatment) Patient is a 37 y.o. female presenting with abdominal pain. The history is provided by the patient.  Abdominal Pain Associated symptoms: diarrhea, nausea and vomiting   Associated symptoms: no chest pain, no dysuria, no fever, no hematuria and no shortness of breath    patient with acute onset of left-sided abdominal pain at 9:30 in the morning. Associated with vomiting and some diarrhea. No blood in either one. Patient's had a history of a partially ruptured appendix and bowel obstruction in the past. Pain is 10 out of 10 constant in nature. No fevers. Patient very uncomfortable with pain with moving in the bed. Patient denies dysuria. Pain is described as sharp in nature. It reminds the patient when she had her bowel obstruction in the past.  Past Medical History  Diagnosis Date  . No pertinent past medical history   . Arthritis    Past Surgical History  Procedure Laterality Date  . Laparoscopic appendectomy  03/30/2012    Procedure: APPENDECTOMY LAPAROSCOPIC;  Surgeon: Wilmon Arms. Corliss Skains, MD;  Location: [facility];  Service: General;  Laterality: N/A;  . Cesarean section  12/26/06  . Cholecystectomy  03/2004  . Appendectomy  03/2012   Family History  Problem Relation Age of Onset  . Cancer Mother     breast  . Cancer Sister     breast  . Cancer Paternal Grandfather     lung   History  Substance Use Topics  . Smoking status: Current Every Day Smoker -- 0.50 packs/day for 17 years    Types: Cigarettes  . Smokeless tobacco: Never Used     Comment: 6 cigs per day  . Alcohol Use: No   OB History   Grav Para Term Preterm Abortions TAB SAB Ect Mult Living                 Review of Systems   Constitutional: Negative for fever.  HENT: Negative for congestion and neck pain.   Eyes: Negative for redness and visual disturbance.  Respiratory: Negative for shortness of breath.   Cardiovascular: Negative for chest pain.  Gastrointestinal: Positive for nausea, vomiting, abdominal pain and diarrhea.  Genitourinary: Positive for flank pain. Negative for dysuria and hematuria.  Musculoskeletal: Negative for back pain.  Neurological: Negative for headaches.  Hematological: Does not bruise/bleed easily.  Psychiatric/Behavioral: Negative for confusion.    Allergies  Penicillins; Sulfa antibiotics; Ciprofloxacin; and Iodine  Home Medications   Current Outpatient Rx  Name  Route  Sig  Dispense  Refill  . cephALEXin (KEFLEX) 500 MG capsule   Oral   Take 1 capsule (500 mg total) by mouth 4 (four) times daily.   40 capsule   0   . HYDROcodone-acetaminophen (NORCO/VICODIN) 5-325 MG per tablet   Oral   Take 1-2 tablets by mouth every 6 (six) hours as needed for pain.   10 tablet   0   . ibuprofen (ADVIL,MOTRIN) 200 MG tablet   Oral   Take 400 mg by mouth every 6 (six) hours as needed. Pain.         Marland Kitchen ibuprofen (ADVIL,MOTRIN) 800 MG tablet   Oral   Take 1 tablet (800 mg total) by mouth 3 (three) times daily.   21  tablet   0   . ondansetron (ZOFRAN ODT) 4 MG disintegrating tablet   Oral   Take 1 tablet (4 mg total) by mouth every 8 (eight) hours as needed for nausea.   12 tablet   0   . promethazine (PHENERGAN) 25 MG tablet   Oral   Take 1 tablet (25 mg total) by mouth every 6 (six) hours as needed for nausea.   20 tablet   0    BP 113/47  Pulse 63  Temp(Src) 97.8 F (36.6 C) (Oral)  Resp 20  Wt 200 lb (90.719 kg)  BMI 33.28 kg/m2  SpO2 100%  LMP 02/04/2013 Physical Exam  Constitutional: She is oriented to person, place, and time. She appears well-developed and well-nourished. She appears distressed.  HENT:  Head: Normocephalic and atraumatic.   Mouth/Throat: Oropharynx is clear and moist.  Eyes: Conjunctivae and EOM are normal. Pupils are equal, round, and reactive to light.  Neck: Normal range of motion.  Cardiovascular: Normal rate, regular rhythm and normal heart sounds.   No murmur heard. Pulmonary/Chest: Effort normal and breath sounds normal.  Abdominal: Bowel sounds are normal. There is tenderness.  Musculoskeletal: Normal range of motion.  Neurological: She is alert and oriented to person, place, and time. No cranial nerve deficit. She exhibits normal muscle tone. Coordination normal.  Skin: Skin is warm. No rash noted. No erythema.    ED Course   Procedures (including critical care time)  Labs Reviewed  CBC WITH DIFFERENTIAL - Abnormal; Notable for the following:    WBC 12.4 (*)    Neutrophils Relative % 81 (*)    Neutro Abs 10.1 (*)    All other components within normal limits  COMPREHENSIVE METABOLIC PANEL - Abnormal; Notable for the following:    Glucose, Bld 111 (*)    All other components within normal limits  URINALYSIS, ROUTINE W REFLEX MICROSCOPIC - Abnormal; Notable for the following:    Color, Urine RED (*)    APPearance TURBID (*)    Specific Gravity, Urine 1.033 (*)    Hgb urine dipstick LARGE (*)    Bilirubin Urine MODERATE (*)    Protein, ur 100 (*)    Nitrite POSITIVE (*)    Leukocytes, UA MODERATE (*)    All other components within normal limits  URINE MICROSCOPIC-ADD ON - Abnormal; Notable for the following:    Bacteria, UA MANY (*)    All other components within normal limits  URINE CULTURE  LIPASE, BLOOD  POCT PREGNANCY, URINE     Results for orders placed during the hospital encounter of 02/07/13  CBC WITH DIFFERENTIAL      Result Value Range   WBC 12.4 (*) 4.0 - 10.5 K/uL   RBC 4.50  3.87 - 5.11 MIL/uL   Hemoglobin 13.2  12.0 - 15.0 g/dL   HCT 62.1  30.8 - 65.7 %   MCV 86.4  78.0 - 100.0 fL   MCH 29.3  26.0 - 34.0 pg   MCHC 33.9  30.0 - 36.0 g/dL   RDW 84.6  96.2 - 95.2 %    Platelets 322  150 - 400 K/uL   Neutrophils Relative % 81 (*) 43 - 77 %   Neutro Abs 10.1 (*) 1.7 - 7.7 K/uL   Lymphocytes Relative 12  12 - 46 %   Lymphs Abs 1.5  0.7 - 4.0 K/uL   Monocytes Relative 5  3 - 12 %   Monocytes Absolute 0.7  0.1 - 1.0  K/uL   Eosinophils Relative 1  0 - 5 %   Eosinophils Absolute 0.1  0.0 - 0.7 K/uL   Basophils Relative 0  0 - 1 %   Basophils Absolute 0.0  0.0 - 0.1 K/uL  COMPREHENSIVE METABOLIC PANEL      Result Value Range   Sodium 139  135 - 145 mEq/L   Potassium 3.5  3.5 - 5.1 mEq/L   Chloride 106  96 - 112 mEq/L   CO2 19  19 - 32 mEq/L   Glucose, Bld 111 (*) 70 - 99 mg/dL   BUN 7  6 - 23 mg/dL   Creatinine, Ser 1.61  0.50 - 1.10 mg/dL   Calcium 9.3  8.4 - 09.6 mg/dL   Total Protein 6.9  6.0 - 8.3 g/dL   Albumin 3.6  3.5 - 5.2 g/dL   AST 17  0 - 37 U/L   ALT 20  0 - 35 U/L   Alkaline Phosphatase 103  39 - 117 U/L   Total Bilirubin 0.3  0.3 - 1.2 mg/dL   GFR calc non Af Amer >90  >90 mL/min   GFR calc Af Amer >90  >90 mL/min  URINALYSIS, ROUTINE W REFLEX MICROSCOPIC      Result Value Range   Color, Urine RED (*) YELLOW   APPearance TURBID (*) CLEAR   Specific Gravity, Urine 1.033 (*) 1.005 - 1.030   pH 5.0  5.0 - 8.0   Glucose, UA NEGATIVE  NEGATIVE mg/dL   Hgb urine dipstick LARGE (*) NEGATIVE   Bilirubin Urine MODERATE (*) NEGATIVE   Ketones, ur NEGATIVE  NEGATIVE mg/dL   Protein, ur 045 (*) NEGATIVE mg/dL   Urobilinogen, UA 1.0  0.0 - 1.0 mg/dL   Nitrite POSITIVE (*) NEGATIVE   Leukocytes, UA MODERATE (*) NEGATIVE  URINE MICROSCOPIC-ADD ON      Result Value Range   Squamous Epithelial / LPF RARE  RARE   WBC, UA 3-6  <3 WBC/hpf   RBC / HPF TOO NUMEROUS TO COUNT  <3 RBC/hpf   Bacteria, UA MANY (*) RARE  LIPASE, BLOOD      Result Value Range   Lipase 44  11 - 59 U/L  POCT PREGNANCY, URINE      Result Value Range   Preg Test, Ur NEGATIVE  NEGATIVE    Ct Abdomen Pelvis W Contrast  02/07/2013   *RADIOLOGY REPORT*  Clinical  Data: Left lower quadrant pain.  Elevated white blood cell count. History of prior appendectomy and cesarean section.  CT ABDOMEN AND PELVIS WITH CONTRAST  Technique:  Multidetector CT imaging of the abdomen and pelvis was performed following the standard protocol during bolus administration of intravenous contrast.  Contrast: 50mL OMNIPAQUE IOHEXOL 300 MG/ML  SOLN, OMNIPAQUE IOHEXOL 300 MG/ML  SOLN  Comparison: 04/27/2012.  Findings: Lung Bases: Scattered areas of subsegmental atelectasis, prominent in the anterior left lower lobe and lingula.  Liver:  Normal.  Spleen:  Normal.  Gallbladder:  Cholecystectomy.  Common bile duct:  Normal.  Pancreas:  Normal.  Adrenal glands:  Unchanged nodularity of the left adrenal gland, probably representing tiny adenomas.  These appear similar to 03/30/2012.  Kidneys:  Normal enhancement.  Mild stranding and ectasia of the left ureter.  Minimal left hydronephrosis.  The previously seen left renal collecting system stone has passed and is now in the distal third of the ureter, just proximal to the left UVJ.  This measures 4 mm.  The right ureter appears  within normal limits.  Stomach:  Normal.  Small bowel:  Normal.  Colon:   Appendectomy.  No inflammatory changes of the colon.  Most of the colon is decompressed and there is mild thickening because of the decompression.  Pelvic Genitourinary:  Physiologic appearance of the uterus and adnexa.  No free fluid.  Urinary bladder appears normal.  Bones:  No aggressive osseous lesions.  L5-S1 predominant lumbar spondylosis.  Vasculature: Mild atherosclerosis.  Body Wall: Fat containing periumbilical hernia with a loop of small bowel extending up to the hernia but no obstruction or entrapment. Fat containing right inguinal hernia is also present.  IMPRESSION: 1.  4 mm distal left ureteral stone with very mild left hydroureteronephrosis. No residual collecting system calculi identified. 2.  Appendectomy and cholecystectomy. 3.   Uncomplicated periumbilical hernia and right inguinal hernia.   Original Report Authenticated By: Andreas Newport, M.D.   1. Ureteral calculus, left   2. Pyelonephritis     MDM  Patient with acute onset of left sided abdominal pain this morning around 9:30 so she of vomiting also associated with some diarrhea. Workup is consistent with a urinary tract infection and a left ureteral stone. No significant hydronephrosis. However we'll treat as if it's a stone related pyelonephritis. Patient nontoxic. Patient given Rocephin 1 g in the emergency apartment we'll continue Keflex. Patient also given urology referral. The stone is only 4 mm in size it is already in the distal ureter will most likely be passed spontaneously. Patient steadily improved by time of discharge.  Shelda Jakes, MD 02/07/13 [phone]

## 2013-02-08 LAB — URINE CULTURE: Colony Count: >=100000

## 2014-09-07 ENCOUNTER — Encounter (HOSPITAL_BASED_OUTPATIENT_CLINIC_OR_DEPARTMENT_OTHER): Payer: Self-pay

## 2014-09-07 ENCOUNTER — Emergency Department (HOSPITAL_BASED_OUTPATIENT_CLINIC_OR_DEPARTMENT_OTHER): Payer: BLUE CROSS/BLUE SHIELD

## 2014-09-07 ENCOUNTER — Emergency Department (HOSPITAL_BASED_OUTPATIENT_CLINIC_OR_DEPARTMENT_OTHER)
Admission: EM | Admit: 2014-09-07 | Discharge: 2014-09-07 | Disposition: A | Discharge status: Home / Self Care | Payer: BLUE CROSS/BLUE SHIELD | Attending: Emergency Medicine | Admitting: Emergency Medicine

## 2014-09-07 DIAGNOSIS — R609 Edema, unspecified: Secondary | ICD-10-CM

## 2014-09-07 DIAGNOSIS — Z791 Long term (current) use of non-steroidal anti-inflammatories (NSAID): Secondary | ICD-10-CM | POA: Insufficient documentation

## 2014-09-07 DIAGNOSIS — R600 Localized edema: Secondary | ICD-10-CM | POA: Diagnosis not present

## 2014-09-07 DIAGNOSIS — Z792 Long term (current) use of antibiotics: Secondary | ICD-10-CM | POA: Insufficient documentation

## 2014-09-07 DIAGNOSIS — Z880 Allergy status to penicillin: Secondary | ICD-10-CM | POA: Diagnosis not present

## 2014-09-07 DIAGNOSIS — M791 Myalgia: Secondary | ICD-10-CM | POA: Insufficient documentation

## 2014-09-07 DIAGNOSIS — M1990 Unspecified osteoarthritis, unspecified site: Secondary | ICD-10-CM | POA: Diagnosis not present

## 2014-09-07 DIAGNOSIS — M79604 Pain in right leg: Secondary | ICD-10-CM | POA: Insufficient documentation

## 2014-09-07 DIAGNOSIS — M79605 Pain in left leg: Secondary | ICD-10-CM | POA: Diagnosis present

## 2014-09-07 LAB — COMPREHENSIVE METABOLIC PANEL
ALK PHOS: 79 U/L (ref 39–117)
ALT: 21 U/L (ref 0–35)
ANION GAP: 6 (ref 5–15)
AST: 20 U/L (ref 0–37)
Albumin: 4.1 g/dL (ref 3.5–5.2)
BILIRUBIN TOTAL: 0.2 mg/dL — AB (ref 0.3–1.2)
BUN: 8 mg/dL (ref 6–23)
CO2: 26 mmol/L (ref 19–32)
Calcium: 9.2 mg/dL (ref 8.4–10.5)
Chloride: 107 mmol/L (ref 96–112)
Creatinine, Ser: 0.77 mg/dL (ref 0.50–1.10)
GLUCOSE: 110 mg/dL — AB (ref 70–99)
POTASSIUM: 3.6 mmol/L (ref 3.5–5.1)
SODIUM: 139 mmol/L (ref 135–145)
Total Protein: 7.3 g/dL (ref 6.0–8.3)

## 2014-09-07 LAB — CBC
HEMATOCRIT: 39.4 % (ref 36.0–46.0)
HEMOGLOBIN: 13.2 g/dL (ref 12.0–15.0)
MCH: 29.5 pg (ref 26.0–34.0)
MCHC: 33.5 g/dL (ref 30.0–36.0)
MCV: 87.9 fL (ref 78.0–100.0)
Platelets: 320 10*3/uL (ref 150–400)
RBC: 4.48 MIL/uL (ref 3.87–5.11)
RDW: 14.4 % (ref 11.5–15.5)
WBC: 10.9 10*3/uL — AB (ref 4.0–10.5)

## 2014-09-07 LAB — BRAIN NATRIURETIC PEPTIDE: B Natriuretic Peptide: 9.6 pg/mL (ref 0.0–100.0)

## 2014-09-07 MED ORDER — IBUPROFEN 800 MG PO TABS
800.0000 mg | ORAL_TABLET | Freq: Once | ORAL | Status: AC
  Administered 2014-09-07: 800 mg via ORAL
  Filled 2014-09-07: qty 1

## 2014-09-07 NOTE — ED Notes (Signed)
Pt reports one week of bilateral lower extremity edema, reports RLE pain, no redness noted, pt denies recent travel, denies numbness or tingling.

## 2014-09-07 NOTE — ED Provider Notes (Signed)
CSN: [id number]     Arrival date & time 09/07/14  1728 History  This chart was scribed for Elwin MochaBlair Clebert Wenger, MD by Chestine SporeSoijett Blue, ED Scribe. The patient was seen in room MHT13/MHT13 at 6:21 PM.     Chief Complaint  Patient presents with  . Leg Pain      Patient is a 39 y.o. female presenting with leg pain. The history is provided by the patient. No language interpreter was used.  Leg Pain Location:  Leg Time since incident:  1 week Injury: no   Leg location:  L leg and R leg Pain details:    Radiates to:  Does not radiate   Severity:  Moderate   Onset quality:  Sudden   Duration:  1 week   Timing:  Constant   Progression:  Unchanged Chronicity:  New Dislocation: no   Foreign body present:  No foreign bodies Prior injury to area:  Unable to specify Relieved by:  None tried Worsened by:  Activity Ineffective treatments:  None tried Associated symptoms: swelling   Associated symptoms: no fatigue, no fever, no numbness and no tingling     HPI Comments: Deborah Dodson is a 39 y.o. female who presents to the Emergency Department complaining of leg pain onset 1 week. Today the pain is shooting on the right side from the toes to the knee. She states that she is having associated symptoms of leg swelling. She denies fever, trouble urinating, vomiting, and any other symptoms. Denies being on estrogen. Denies any recent travel.    Past Medical History  Diagnosis Date  . No pertinent past medical history   . Arthritis    Past Surgical History  Procedure Laterality Date  . Laparoscopic appendectomy  03/30/2012    Procedure: APPENDECTOMY LAPAROSCOPIC;  Surgeon: Wilmon ArmsMatthew K. Corliss Skainssuei, MD;  Location: [facility];  Service: General;  Laterality: N/A;  . Cesarean section  12/26/06  . Cholecystectomy  03/2004  . Appendectomy  03/2012   Family History  Problem Relation Age of Onset  . Cancer Mother     breast  . Cancer Sister     breast  . Cancer Paternal Grandfather     lung   History   Substance Use Topics  . Smoking status: Current Every Day Smoker -- 0.50 packs/day for 17 years    Types: Cigarettes  . Smokeless tobacco: Never Used     Comment: 6 cigs per day  . Alcohol Use: No   OB History    No data available     Review of Systems  Constitutional: Negative for fever and fatigue.  Respiratory: Negative for cough and shortness of breath.   Gastrointestinal: Negative for vomiting.  Musculoskeletal: Positive for myalgias (bilateral calves).  Skin: Negative for color change.  Neurological: Negative for numbness.  All other systems reviewed and are negative.     Allergies  Penicillins; Sulfa antibiotics; Ciprofloxacin; and Iodine  Home Medications   Prior to Admission medications   Medication Sig Start Date End Date Taking? Authorizing Provider  cephALEXin (KEFLEX) 500 MG capsule Take 1 capsule (500 mg total) by mouth 4 (four) times daily. 02/07/13   Vanetta MuldersScott Zackowski, MD  HYDROcodone-acetaminophen (NORCO/VICODIN) 5-325 MG per tablet Take 1-2 tablets by mouth every 6 (six) hours as needed for pain. 02/07/13   Vanetta MuldersScott Zackowski, MD  ibuprofen (ADVIL,MOTRIN) 200 MG tablet Take 400 mg by mouth every 6 (six) hours as needed. Pain.    Historical Provider, MD  ibuprofen (ADVIL,MOTRIN) 800 MG tablet Take  1 tablet (800 mg total) by mouth 3 (three) times daily. 02/07/13   Vanetta Mulders, MD  ondansetron (ZOFRAN ODT) 4 MG disintegrating tablet Take 1 tablet (4 mg total) by mouth every 8 (eight) hours as needed for nausea. 02/07/13   Vanetta Mulders, MD  promethazine (PHENERGAN) 25 MG tablet Take 1 tablet (25 mg total) by mouth every 6 (six) hours as needed for nausea. 02/07/13   Vanetta Mulders, MD   BP 100/54 mmHg  Pulse 101  Temp(Src) 98.1 F (36.7 C) (Oral)  Resp 16  Ht  (1.651 m)  Wt 182 lb (82.555 kg)  BMI 30.29 kg/m2  SpO2 93%  LMP 09/05/2014  Physical Exam  Constitutional: She is oriented to person, place, and time. She appears well-developed and  well-nourished. No distress.  HENT:  Head: Normocephalic.  Mouth/Throat: Oropharynx is clear and moist.  Eyes: Pupils are equal, round, and reactive to light.  Neck: Neck supple.  Cardiovascular: Normal rate, regular rhythm and normal heart sounds.   Pulmonary/Chest: Effort normal and breath sounds normal. No respiratory distress. She has no wheezes.  Abdominal: Soft. She exhibits no distension. There is no tenderness. There is no rebound and no guarding.  Musculoskeletal: Normal range of motion. She exhibits edema (mild, non-pitting, bilateral). She exhibits no tenderness.  Neurological: She is alert and oriented to person, place, and time.  Skin: Skin is warm and dry.  Psychiatric: She has a normal mood and affect.  Nursing note and vitals reviewed.   ED Course  Procedures (including critical care time) DIAGNOSTIC STUDIES: Oxygen Saturation is 93% on RA, low by my interpretation.    COORDINATION OF CARE: 6:23 PM-Discussed treatment plan which includes ibuprofen, [name], CBC, CMAT with pt at bedside and pt agreed to plan.   Labs Review Labs Reviewed - No data to display  Imaging Review No results found.   EKG Interpretation None      MDM   Final diagnoses:  None    31F here with bilateral leg swelling. Present for one week. No recent travel. Not on any estrogens. Stating mostly having pain in the right calf. Mild nonpitting edema noted bilaterally. Normal pulses in both feet. Normal sensation and strength. Ultrasound negative for DVT. Labs negative for CHF, normal albumin, normal electrolytes. Likely just peripheral edema. Instructed to use TED hose. Stable for discharge.  I personally performed the services described in this documentation, which was scribed in my presence. The recorded information has been reviewed and is accurate.     Elwin Mocha, MD 09/07/14 [phone]

## 2014-09-07 NOTE — Discharge Instructions (Signed)
Peripheral Edema °You have swelling in your legs (peripheral edema). This swelling is due to excess accumulation of salt and water in your body. Edema may be a sign of heart, kidney or liver disease, or a side effect of a medication. It may also be due to problems in the leg veins. Elevating your legs and using special support stockings may be very helpful, if the cause of the swelling is due to poor venous circulation. Avoid long periods of standing, whatever the cause. °Treatment of edema depends on identifying the cause. Chips, pretzels, pickles and other salty foods should be avoided. Restricting salt in your diet is almost always needed. Water pills (diuretics) are often used to remove the excess salt and water from your body via urine. These medicines prevent the kidney from reabsorbing sodium. This increases urine flow. °Diuretic treatment may also result in lowering of potassium levels in your body. Potassium supplements may be needed if you have to use diuretics daily. Daily weights can help you keep track of your progress in clearing your edema. You should call your caregiver for follow up care as recommended. °SEEK IMMEDIATE MEDICAL CARE IF:  °· You have increased swelling, pain, redness, or heat in your legs. °· You develop shortness of breath, especially when lying down. °· You develop chest or abdominal pain, weakness, or fainting. °· You have a fever. °Document Released: 07/18/2004 Document Revised: 09/02/2011 Document Reviewed: 06/28/2009 °Exit[employer]® Patient Information ©2015 [employer]. This information is not intended to replace advice given to you by your health care provider. Make sure you discuss any questions you have with your health care provider. ° °

## 2019-10-27 ENCOUNTER — Emergency Department (HOSPITAL_BASED_OUTPATIENT_CLINIC_OR_DEPARTMENT_OTHER)
Admission: EM | Admit: 2019-10-27 | Discharge: 2019-10-27 | Disposition: A | Discharge status: Home / Self Care | Payer: 59 | Attending: Emergency Medicine | Admitting: Emergency Medicine

## 2019-10-27 ENCOUNTER — Other Ambulatory Visit: Payer: Self-pay

## 2019-10-27 ENCOUNTER — Emergency Department (HOSPITAL_BASED_OUTPATIENT_CLINIC_OR_DEPARTMENT_OTHER): Payer: 59

## 2019-10-27 ENCOUNTER — Encounter (HOSPITAL_BASED_OUTPATIENT_CLINIC_OR_DEPARTMENT_OTHER): Payer: Self-pay | Admitting: Emergency Medicine

## 2019-10-27 DIAGNOSIS — Z880 Allergy status to penicillin: Secondary | ICD-10-CM | POA: Insufficient documentation

## 2019-10-27 DIAGNOSIS — R1084 Generalized abdominal pain: Secondary | ICD-10-CM | POA: Insufficient documentation

## 2019-10-27 LAB — URINALYSIS, ROUTINE W REFLEX MICROSCOPIC
Bilirubin Urine: NEGATIVE
Glucose, UA: NEGATIVE mg/dL
Ketones, ur: NEGATIVE mg/dL
Leukocytes,Ua: NEGATIVE
Nitrite: NEGATIVE
Protein, ur: NEGATIVE mg/dL
Specific Gravity, Urine: 1.01 (ref 1.005–1.030)
pH: 6 (ref 5.0–8.0)

## 2019-10-27 LAB — CBC WITH DIFFERENTIAL/PLATELET
Abs Immature Granulocytes: 0.05 10*3/uL (ref 0.00–0.07)
Basophils Absolute: 0.1 10*3/uL (ref 0.0–0.1)
Basophils Relative: 1 %
Eosinophils Absolute: 0.2 10*3/uL (ref 0.0–0.5)
Eosinophils Relative: 1 %
HCT: 40.6 % (ref 36.0–46.0)
Hemoglobin: 13.5 g/dL (ref 12.0–15.0)
Immature Granulocytes: 0 %
Lymphocytes Relative: 19 %
Lymphs Abs: 2.4 10*3/uL (ref 0.7–4.0)
MCH: 29.6 pg (ref 26.0–34.0)
MCHC: 33.3 g/dL (ref 30.0–36.0)
MCV: 89 fL (ref 80.0–100.0)
Monocytes Absolute: 0.9 10*3/uL (ref 0.1–1.0)
Monocytes Relative: 7 %
Neutro Abs: 9.3 10*3/uL — ABNORMAL HIGH (ref 1.7–7.7)
Neutrophils Relative %: 72 %
Platelets: 311 10*3/uL (ref 150–400)
RBC: 4.56 MIL/uL (ref 3.87–5.11)
RDW: 14.2 % (ref 11.5–15.5)
WBC: 12.9 10*3/uL — ABNORMAL HIGH (ref 4.0–10.5)
nRBC: 0 % (ref 0.0–0.2)

## 2019-10-27 LAB — COMPREHENSIVE METABOLIC PANEL
ALT: 15 U/L (ref 0–44)
AST: 15 U/L (ref 15–41)
Albumin: 3.7 g/dL (ref 3.5–5.0)
Alkaline Phosphatase: 81 U/L (ref 38–126)
Anion gap: 7 (ref 5–15)
BUN: 11 mg/dL (ref 6–20)
CO2: 23 mmol/L (ref 22–32)
Calcium: 9.1 mg/dL (ref 8.9–10.3)
Chloride: 110 mmol/L (ref 98–111)
Creatinine, Ser: 0.61 mg/dL (ref 0.44–1.00)
GFR calc Af Amer: >60 mL/min (ref >60)
GFR calc non Af Amer: >60 mL/min (ref >60)
Glucose, Bld: 119 mg/dL — ABNORMAL HIGH (ref 70–99)
Potassium: 3.4 mmol/L — ABNORMAL LOW (ref 3.5–5.1)
Sodium: 140 mmol/L (ref 135–145)
Total Bilirubin: 0.2 mg/dL — ABNORMAL LOW (ref 0.3–1.2)
Total Protein: 6.7 g/dL (ref 6.5–8.1)

## 2019-10-27 LAB — URINALYSIS, MICROSCOPIC (REFLEX)

## 2019-10-27 LAB — LIPASE, BLOOD: Lipase: 41 U/L (ref 11–51)

## 2019-10-27 MED ORDER — DICYCLOMINE HCL 20 MG PO TABS: 20.0000 mg | ORAL_TABLET | Freq: Three times a day (TID) | ORAL | 0 refills | Status: DC | PRN

## 2019-10-27 MED ORDER — DICYCLOMINE HCL 10 MG/ML IM SOLN
20.0000 mg | Freq: Once | INTRAMUSCULAR | Status: AC
  Administered 2019-10-27: 03:00:00 20 mg via INTRAMUSCULAR
  Filled 2019-10-27: qty 2

## 2019-10-27 MED ORDER — IOHEXOL 300 MG/ML  SOLN
100.0000 mL | Freq: Once | INTRAMUSCULAR | Status: AC | PRN
  Administered 2019-10-27: 04:00:00 100 mL via INTRAVENOUS

## 2019-10-27 NOTE — Discharge Instructions (Addendum)
Your labs, urine, CT imaging were reassuring today.  Your symptoms may be secondary to mild constipation.  You may use over-the-counter MiraLAX as needed as well as over-the-counter Benefiber or Metamucil.  Please increase your water intake.  I recommend a bland diet for the next several days.  If symptoms are not improving or begin to worsen, please follow-up with your primary care provider.

## 2019-10-27 NOTE — ED Notes (Signed)
Pt is finished drinking her po contrast  Tolerating well

## 2019-10-27 NOTE — ED Triage Notes (Signed)
Pt is c/o lower abd pain that started yesterday  Denies N/V/D, vaginal discharge or spotting, also denies any urinary sxs   Last BM was on the way here

## 2019-10-27 NOTE — ED Provider Notes (Signed)
TIME SEEN: 1:58 AM  CHIEF COMPLAINT: abdominal pain  HPI: Patient is a 44 year old female with history of arthritis, obesity who presents to the emergency department with generalized abdominal pain.  States started yesterday but progressively worsened today.  States she feels like she is "bound up".  She describes it as a pressure throughout her abdomen.  No aggravating or alleviating factors.  She states she has had normal bowel movements but does not feel like she is passing gas like she normally does.  She had a bowel movement just prior to arrival that was normal in consistency and appearance without blood or melena.  She denies any associated fevers, chills, nausea, vomiting, diarrhea, dysuria, hematuria, vaginal bleeding or discharge.  She is status post cholecystectomy and appendectomy.  Has never had similar symptoms.  No history of bowel obstruction.  ROS: See HPI Constitutional: no fever  Eyes: no drainage  ENT: no runny nose   Cardiovascular:  no chest pain  Resp: no SOB  GI: no vomiting GU: no dysuria Integumentary: no rash  Allergy: no hives  Musculoskeletal: no leg swelling  Neurological: no slurred speech ROS otherwise negative  PAST MEDICAL HISTORY/PAST SURGICAL HISTORY:  Past Medical History:  Diagnosis Date  . Arthritis   . No pertinent past medical history     MEDICATIONS:  Prior to Admission medications   Medication Sig Start Date End Date Taking? Authorizing Provider  cephALEXin (KEFLEX) 500 MG capsule Take 1 capsule (500 mg total) by mouth 4 (four) times daily. 02/07/13   Fredia Sorrow, MD  HYDROcodone-acetaminophen (NORCO/VICODIN) 5-325 MG per tablet Take 1-2 tablets by mouth every 6 (six) hours as needed for pain. 02/07/13   Fredia Sorrow, MD  ibuprofen (ADVIL,MOTRIN) 200 MG tablet Take 400 mg by mouth every 6 (six) hours as needed. Pain.    [provider]  ibuprofen (ADVIL,MOTRIN) 800 MG tablet Take 1 tablet (800 mg total) by mouth 3 (three)  times daily. 02/07/13   Fredia Sorrow, MD  ondansetron (ZOFRAN ODT) 4 MG disintegrating tablet Take 1 tablet (4 mg total) by mouth every 8 (eight) hours as needed for nausea. 02/07/13   Fredia Sorrow, MD  promethazine (PHENERGAN) 25 MG tablet Take 1 tablet (25 mg total) by mouth every 6 (six) hours as needed for nausea. 02/07/13   Fredia Sorrow, MD    ALLERGIES:  Allergies  Allergen Reactions  . Penicillins Other (See Comments)    Childhood reaction  . Sulfa Antibiotics   . Ciprofloxacin Itching  . Iodine Rash    Rash when iodine applied to skin    SOCIAL HISTORY:  Social History   Tobacco Use  . Smoking status: Current Every Day Smoker    Packs/day: 0.50    Years: 17.00    Pack years: 8.50    Types: Cigarettes  . Smokeless tobacco: Never Used  . Tobacco comment: 6 cigs per day  Substance Use Topics  . Alcohol use: No    FAMILY HISTORY: Family History  Problem Relation Age of Onset  . Cancer Mother        breast  . Diabetes Father   . CAD Father   . Cancer Sister        breast  . Cancer Paternal Grandfather        lung    EXAM: BP 131/86 (BP Location: Right Arm)   Pulse 93   Temp 97.9 F (36.6 C) (Oral)   Resp 18   Ht 5' 5.5" (1.664 m)  Wt 88.9 kg   LMP 09/05/2014   SpO2 96%   BMI 32.12 kg/m  CONSTITUTIONAL: Alert and oriented and responds appropriately to questions. Well-appearing; well-nourished HEAD: Normocephalic EYES: Conjunctivae clear, pupils appear equal, EOM appear intact ENT: normal nose; moist mucous membranes NECK: Supple, normal ROM CARD: RRR; S1 and S2 appreciated; no murmurs, no clicks, no rubs, no gallops RESP: Normal chest excursion without splinting or tachypnea; breath sounds clear and equal bilaterally; no wheezes, no rhonchi, no rales, no hypoxia or respiratory distress, speaking full sentences ABD/GI: Normal bowel sounds; non-distended; soft, tender throughout the entire abdomen, no rebound, no guarding, no peritoneal signs,  no hepatosplenomegaly, no hernia appreciated, truncal obesity present BACK:  The back appears normal EXT: Normal ROM in all joints; no deformity noted, no edema; no cyanosis SKIN: Normal color for age and race; warm; no rash on exposed skin NEURO: Moves all extremities equally PSYCH: The patient's mood and manner are appropriate.   MEDICAL DECISION MAKING: Patient here with generalized abdominal pain.  Differential includes constipation, bowel obstruction, colitis, diverticulitis, cholecystitis, pancreatitis, UTI.  Will obtain labs, urine.  Will give Bentyl for discomfort.  Patient drove herself to the emergency department.  Will perform CT of abdomen pelvis.  ED PROGRESS: Labs, urine, CT imaging reviewed/interpreted and show mild leukocytosis with left shift.  This appears chronic in her previous labs.  Otherwise normal creatinine, LFTs, lipase.  Urine does not appear infected.  She is postmenopausal.  CT scan shows no acute finding.  She reports feeling better after Bentyl.  Will discharge with prescription of the same.  Recommended bland diet for the next several days and follow-up with her outpatient PCP for continued symptoms.  She feels she may be constipated.  Recommended over-the-counter MiraLAX as needed.  Discussed return precautions.  I feel she is safe for discharge home.   At this time, I do not feel there is any life-threatening condition present. I have reviewed, interpreted and discussed all results (EKG, imaging, lab, urine as appropriate) and exam findings with patient/family. I have reviewed nursing notes and appropriate previous records.  I feel the patient is safe to be discharged home without further emergent workup and can continue workup as an outpatient as needed. Discussed usual and customary return precautions. Patient/family verbalize understanding and are comfortable with this plan.  Outpatient follow-up has been provided as needed. All questions have been  answered.      Meghen Akopyan was evaluated in Emergency Department on 10/27/2019 for the symptoms described in the history of present illness. She was evaluated in the context of the global COVID-19 pandemic, which necessitated consideration that the patient might be at risk for infection with the SARS-CoV-2 virus that causes COVID-19. Institutional protocols and algorithms that pertain to the evaluation of patients at risk for COVID-19 are in a state of rapid change based on information released by regulatory bodies including the CDC and federal and state organizations. These policies and algorithms were followed during the patient's care in the ED.      Khamani Fairley, Layla Maw, DO 10/27/19 [phone]

## 2019-10-27 NOTE — ED Notes (Signed)
Pt returned from CT °

## 2021-02-10 DIAGNOSIS — M25561 Pain in right knee: Secondary | ICD-10-CM | POA: Insufficient documentation

## 2021-03-18 ENCOUNTER — Ambulatory Visit
Admission: EM | Admit: 2021-03-18 | Discharge: 2021-03-18 | Disposition: A | Discharge status: Home / Self Care | Payer: 59

## 2021-03-18 ENCOUNTER — Other Ambulatory Visit: Payer: Self-pay

## 2021-03-18 DIAGNOSIS — J069 Acute upper respiratory infection, unspecified: Secondary | ICD-10-CM

## 2021-03-18 DIAGNOSIS — Z1152 Encounter for screening for COVID-19: Secondary | ICD-10-CM

## 2021-03-18 DIAGNOSIS — Z20822 Contact with and (suspected) exposure to covid-19: Secondary | ICD-10-CM | POA: Diagnosis not present

## 2021-03-18 DIAGNOSIS — H65191 Other acute nonsuppurative otitis media, right ear: Secondary | ICD-10-CM | POA: Diagnosis not present

## 2021-03-18 DIAGNOSIS — R059 Cough, unspecified: Secondary | ICD-10-CM

## 2021-03-18 MED ORDER — CEFDINIR 300 MG PO CAPS: 300.0000 mg | ORAL_CAPSULE | Freq: Two times a day (BID) | ORAL | 0 refills | Status: AC

## 2021-03-18 NOTE — ED Triage Notes (Signed)
4 day h/o cough, congestion and onset yesterday of right ear pain. Tested negative for covid and flu. Has been taking Singulair and zyrtec without relief.

## 2021-03-18 NOTE — Discharge Instructions (Addendum)
You likely having a viral upper respiratory infection. We recommended symptom control. I expect your symptoms to start improving in the next 1-2 weeks.   1. Take a daily allergy pill/anti-histamine like Zyrtec, Claritin, or Store brand consistently for 2 weeks  2. For congestion you may try an oral decongestant like Mucinex or sudafed. You may also try intranasal flonase nasal spray or saline irrigations (neti pot, sinus cleanse)  3. For your sore throat you may try cepacol lozenges, salt water gargles, throat spray. Treatment of congestion may also help your sore throat.  4. For cough you may try Robitussen, Mucinex DM  5. Take Tylenol or Ibuprofen to help with pain/inflammation  6. Stay hydrated, drink plenty of fluids to keep throat coated and less irritated  Honey Tea For cough/sore throat try using a honey-based tea. Use 3 teaspoons of honey with juice squeezed from half lemon. Place shaved pieces of ginger into 1/2-1 cup of water and warm over stove top. Then mix the ingredients and repeat every 4 hours as needed.   You have also been prescribed cefdinir antibiotic to treat right ear infection.  Covid 19 test is pending.  We will call if it is positive.

## 2021-03-18 NOTE — ED Provider Notes (Signed)
[facility]    CSN: [id number] Arrival date & time: 03/18/21  1042      History   Chief Complaint Chief Complaint  Patient presents with   Cough   Otalgia    right   Nasal Congestion    HPI Deborah Dodson is a 45 y.o. female.   Patient presents with 4-day history of cough, nasal congestion, right ear pain.  Patient reports the ear pain started yesterday.  Cough is not productive.  Patient was seen at different urgent care on 03/15/2021 and received prescription for cetirizine and Singulair with no improvement in symptoms.  Rapid COVID-19 and flu test were negative at that visit.  Denies any chest pain, shortness of breath, known fevers.  Patient has been exposed to COVID-19 with family members in the same household.   Cough Otalgia  Past Medical History:  Diagnosis Date   Arthritis    No pertinent past medical history     There are no problems to display for this patient.   Past Surgical History:  Procedure Laterality Date   APPENDECTOMY  03/2012   CESAREAN SECTION  12/26/06   CHOLECYSTECTOMY  03/2004   LAPAROSCOPIC APPENDECTOMY  03/30/2012   Procedure: APPENDECTOMY LAPAROSCOPIC;  Surgeon: Wilmon Arms. Corliss Skains, MD;  Location: [facility];  Service: General;  Laterality: N/A;    OB History   No obstetric history on file.      Home Medications    Prior to Admission medications   Medication Sig Start Date End Date Taking? Authorizing Provider  cefdinir (OMNICEF) 300 MG capsule Take 1 capsule (300 mg total) by mouth 2 (two) times daily for 10 days. 03/18/21 03/28/21 Yes Lance Muss, FNP  cephALEXin (KEFLEX) 500 MG capsule Take 1 capsule (500 mg total) by mouth 4 (four) times daily. 02/07/13   Vanetta Mulders, MD  cetirizine (ZYRTEC) 10 MG tablet cetirizine 10 mg tablet  Take 1 tablet every day by oral route in the morning.    [provider]  dicyclomine (BENTYL) 20 MG tablet Take 1 tablet (20 mg total) by mouth every 8 (eight) hours as needed for  spasms (Abdominal cramping). 10/27/19   Ward, Layla Maw, DO  HYDROcodone-acetaminophen (NORCO/VICODIN) 5-325 MG per tablet Take 1-2 tablets by mouth every 6 (six) hours as needed for pain. 02/07/13   Vanetta Mulders, MD  ibuprofen (ADVIL,MOTRIN) 200 MG tablet Take 400 mg by mouth every 6 (six) hours as needed. Pain.    [provider]  ibuprofen (ADVIL,MOTRIN) 800 MG tablet Take 1 tablet (800 mg total) by mouth 3 (three) times daily. 02/07/13   Vanetta Mulders, MD  montelukast (SINGULAIR) 10 MG tablet montelukast 10 mg tablet  Take 1 tablet every day by oral route at bedtime.    [provider]  ondansetron (ZOFRAN ODT) 4 MG disintegrating tablet Take 1 tablet (4 mg total) by mouth every 8 (eight) hours as needed for nausea. 02/07/13   Vanetta Mulders, MD  promethazine (PHENERGAN) 25 MG tablet Take 1 tablet (25 mg total) by mouth every 6 (six) hours as needed for nausea. 02/07/13   Vanetta Mulders, MD    Family History Family History  Problem Relation Age of Onset   Cancer Mother        breast   Diabetes Father    CAD Father    Cancer Sister        breast   Cancer Paternal Grandfather        lung    Social History  Social History   Tobacco Use   Smoking status: Every Day    Packs/day: 0.50    Years: 17.00    Pack years: 8.50    Types: Cigarettes   Smokeless tobacco: Never   Tobacco comments:    6 cigs per day  Vaping Use   Vaping Use: Never used  Substance Use Topics   Alcohol use: No   Drug use: No     Allergies   Penicillins, Sulfa antibiotics, Ciprofloxacin, and Iodine   Review of Systems Review of Systems Per HPI  Physical Exam Triage Vital Signs ED Triage Vitals [03/18/21 1234]  Enc Vitals Group     BP (!) 143/81     Pulse Rate 98     Resp 18     Temp 98.5 F (36.9 C)     Temp Source Oral     SpO2 95 %     Weight      Height      Head Circumference      Peak Flow      Pain Score 8     Pain Loc      Pain Edu?      Excl. in GC?     No data found.  Updated Vital Signs BP (!) 143/81 (BP Location: Left Arm)   Pulse 98   Temp 98.5 F (36.9 C) (Oral)   Resp 18   LMP 09/05/2014   SpO2 95%   Visual Acuity Right Eye Distance:   Left Eye Distance:   Bilateral Distance:    Right Eye Near:   Left Eye Near:    Bilateral Near:     Physical Exam Constitutional:      General: She is not in acute distress.    Appearance: Normal appearance.  HENT:     Head: Normocephalic and atraumatic.     Right Ear: Ear canal normal. Tympanic membrane is erythematous. Tympanic membrane is not bulging.     Left Ear: Tympanic membrane and ear canal normal.     Nose: Congestion present.     Mouth/Throat:     Mouth: Mucous membranes are moist.     Pharynx: Posterior oropharyngeal erythema present.  Eyes:     Extraocular Movements: Extraocular movements intact.     Conjunctiva/sclera: Conjunctivae normal.     Pupils: Pupils are equal, round, and reactive to light.  Cardiovascular:     Rate and Rhythm: Normal rate and regular rhythm.     Pulses: Normal pulses.     Heart sounds: Normal heart sounds.  Pulmonary:     Effort: Pulmonary effort is normal. No respiratory distress.     Breath sounds: Normal breath sounds. No wheezing.  Abdominal:     General: Abdomen is flat. Bowel sounds are normal.     Palpations: Abdomen is soft.  Musculoskeletal:        General: Normal range of motion.     Cervical back: Normal range of motion.  Skin:    General: Skin is warm and dry.  Neurological:     General: No focal deficit present.     Mental Status: She is alert and oriented to person, place, and time. Mental status is at baseline.  Psychiatric:        Mood and Affect: Mood normal.        Behavior: Behavior normal.     UC Treatments / Results  Labs (all labs ordered are listed, but only abnormal results are displayed) Labs Reviewed  NOVEL  CORONAVIRUS, NAA    EKG   Radiology No results found.  Procedures Procedures  (including critical care time)  Medications Ordered in UC Medications - No data to display  Initial Impression / Assessment and Plan / UC Course  I have reviewed the triage vital signs and the nursing notes.  Pertinent labs & imaging results that were available during my care of the patient were reviewed by me and considered in my medical decision making (see chart for details).     Patient presents with symptoms likely from a viral upper respiratory infection. Differential includes bacterial pneumonia, sinusitis, allergic rhinitis, Covid 19. Do not suspect underlying cardiopulmonary process. Symptoms seem unlikely related to ACS, CHF or COPD exacerbations, pneumonia, pneumothorax. Patient is nontoxic appearing and not in need of emergent medical intervention.  Will treat right otitis media with cefdinir x10 days.  COVID-19 PCR pending.  Recommended symptom control with over the counter medications: Daily oral anti-histamine, Oral decongestant or IN corticosteroid, saline irrigations, cepacol lozenges, Robitussin, Delsym, honey tea.  Return if symptoms fail to improve in 1-2 weeks or you develop shortness of breath, chest pain, severe headache. Patient states understanding and is agreeable.  Discharged with PCP followup.  Final Clinical Impressions(s) / UC Diagnoses   Final diagnoses:  Acute upper respiratory infection  Cough  Other non-recurrent acute nonsuppurative otitis media of right ear  Encounter for laboratory testing for COVID-19 virus     Discharge Instructions      You likely having a viral upper respiratory infection. We recommended symptom control. I expect your symptoms to start improving in the next 1-2 weeks.   1. Take a daily allergy pill/anti-histamine like Zyrtec, Claritin, or Store brand consistently for 2 weeks  2. For congestion you may try an oral decongestant like Mucinex or sudafed. You may also try intranasal flonase nasal spray or saline irrigations  (neti pot, sinus cleanse)  3. For your sore throat you may try cepacol lozenges, salt water gargles, throat spray. Treatment of congestion may also help your sore throat.  4. For cough you may try Robitussen, Mucinex DM  5. Take Tylenol or Ibuprofen to help with pain/inflammation  6. Stay hydrated, drink plenty of fluids to keep throat coated and less irritated  Honey Tea For cough/sore throat try using a honey-based tea. Use 3 teaspoons of honey with juice squeezed from half lemon. Place shaved pieces of ginger into 1/2-1 cup of water and warm over stove top. Then mix the ingredients and repeat every 4 hours as needed.   You have also been prescribed cefdinir antibiotic to treat right ear infection.  Covid 19 test is pending.  We will call if it is positive.     ED Prescriptions     Medication Sig Dispense Auth. Provider   cefdinir (OMNICEF) 300 MG capsule Take 1 capsule (300 mg total) by mouth 2 (two) times daily for 10 days. 20 capsule Lance Muss, FNP      PDMP not reviewed this encounter.   Lance Muss, FNP 03/18/21 1258

## 2021-03-19 LAB — SARS-COV-2, NAA 2 DAY TAT

## 2021-03-19 LAB — NOVEL CORONAVIRUS, NAA: SARS-CoV-2, NAA: NOT DETECTED

## 2021-04-29 ENCOUNTER — Encounter (HOSPITAL_BASED_OUTPATIENT_CLINIC_OR_DEPARTMENT_OTHER): Payer: Self-pay | Admitting: *Deleted

## 2021-04-29 ENCOUNTER — Emergency Department (HOSPITAL_BASED_OUTPATIENT_CLINIC_OR_DEPARTMENT_OTHER)
Admission: EM | Admit: 2021-04-29 | Discharge: 2021-04-29 | Disposition: A | Discharge status: Home / Self Care | Payer: 59 | Attending: Emergency Medicine | Admitting: Emergency Medicine

## 2021-04-29 ENCOUNTER — Ambulatory Visit
Admission: EM | Admit: 2021-04-29 | Discharge: 2021-04-29 | Disposition: A | Discharge status: Home / Self Care | Payer: 59 | Attending: Internal Medicine | Admitting: Internal Medicine

## 2021-04-29 ENCOUNTER — Other Ambulatory Visit: Payer: Self-pay

## 2021-04-29 DIAGNOSIS — R112 Nausea with vomiting, unspecified: Secondary | ICD-10-CM

## 2021-04-29 DIAGNOSIS — R6889 Other general symptoms and signs: Secondary | ICD-10-CM | POA: Diagnosis not present

## 2021-04-29 DIAGNOSIS — Z20822 Contact with and (suspected) exposure to covid-19: Secondary | ICD-10-CM | POA: Diagnosis not present

## 2021-04-29 DIAGNOSIS — K529 Noninfective gastroenteritis and colitis, unspecified: Secondary | ICD-10-CM | POA: Diagnosis not present

## 2021-04-29 DIAGNOSIS — B349 Viral infection, unspecified: Secondary | ICD-10-CM | POA: Diagnosis not present

## 2021-04-29 DIAGNOSIS — E876 Hypokalemia: Secondary | ICD-10-CM | POA: Diagnosis not present

## 2021-04-29 DIAGNOSIS — F17210 Nicotine dependence, cigarettes, uncomplicated: Secondary | ICD-10-CM | POA: Insufficient documentation

## 2021-04-29 DIAGNOSIS — E860 Dehydration: Secondary | ICD-10-CM | POA: Diagnosis present

## 2021-04-29 DIAGNOSIS — R051 Acute cough: Secondary | ICD-10-CM

## 2021-04-29 LAB — COMPREHENSIVE METABOLIC PANEL
ALT: 24 U/L (ref 0–44)
AST: 24 U/L (ref 15–41)
Albumin: 3.6 g/dL (ref 3.5–5.0)
Alkaline Phosphatase: 80 U/L (ref 38–126)
Anion gap: 12 (ref 5–15)
BUN: 11 mg/dL (ref 6–20)
CO2: 23 mmol/L (ref 22–32)
Calcium: 8.6 mg/dL — ABNORMAL LOW (ref 8.9–10.3)
Chloride: 103 mmol/L (ref 98–111)
Creatinine, Ser: 0.87 mg/dL (ref 0.44–1.00)
GFR, Estimated: >60 mL/min (ref >60)
Glucose, Bld: 100 mg/dL — ABNORMAL HIGH (ref 70–99)
Potassium: 3 mmol/L — ABNORMAL LOW (ref 3.5–5.1)
Sodium: 138 mmol/L (ref 135–145)
Total Bilirubin: 0.3 mg/dL (ref 0.3–1.2)
Total Protein: 7 g/dL (ref 6.5–8.1)

## 2021-04-29 LAB — URINALYSIS, ROUTINE W REFLEX MICROSCOPIC
Bilirubin Urine: NEGATIVE
Glucose, UA: NEGATIVE mg/dL
Ketones, ur: NEGATIVE mg/dL
Leukocytes,Ua: NEGATIVE
Nitrite: NEGATIVE
Protein, ur: 30 mg/dL — AB
Specific Gravity, Urine: 1.025 (ref 1.005–1.030)
pH: 6 (ref 5.0–8.0)

## 2021-04-29 LAB — CBC
HCT: 47.1 % — ABNORMAL HIGH (ref 36.0–46.0)
Hemoglobin: 15.8 g/dL — ABNORMAL HIGH (ref 12.0–15.0)
MCH: 29 pg (ref 26.0–34.0)
MCHC: 33.5 g/dL (ref 30.0–36.0)
MCV: 86.4 fL (ref 80.0–100.0)
Platelets: 404 10*3/uL — ABNORMAL HIGH (ref 150–400)
RBC: 5.45 MIL/uL — ABNORMAL HIGH (ref 3.87–5.11)
RDW: 14.4 % (ref 11.5–15.5)
WBC: 11 10*3/uL — ABNORMAL HIGH (ref 4.0–10.5)
nRBC: 0 % (ref 0.0–0.2)

## 2021-04-29 LAB — URINALYSIS, MICROSCOPIC (REFLEX)

## 2021-04-29 LAB — POCT INFLUENZA A/B
Influenza A, POC: NEGATIVE
Influenza B, POC: NEGATIVE

## 2021-04-29 LAB — PREGNANCY, URINE: Preg Test, Ur: NEGATIVE

## 2021-04-29 LAB — LIPASE, BLOOD: Lipase: 31 U/L (ref 11–51)

## 2021-04-29 MED ORDER — POTASSIUM CHLORIDE 10 MEQ/100ML IV SOLN
10.0000 meq | Freq: Once | INTRAVENOUS | Status: AC
  Administered 2021-04-29: 10 meq via INTRAVENOUS
  Filled 2021-04-29: qty 100

## 2021-04-29 MED ORDER — POTASSIUM CHLORIDE CRYS ER 20 MEQ PO TBCR: 20.0000 meq | EXTENDED_RELEASE_TABLET | Freq: Two times a day (BID) | ORAL | 0 refills | Status: DC

## 2021-04-29 MED ORDER — ONDANSETRON 4 MG PO TBDP: 4.0000 mg | ORAL_TABLET | Freq: Three times a day (TID) | ORAL | 1 refills | Status: DC | PRN

## 2021-04-29 MED ORDER — ONDANSETRON HCL 4 MG/2ML IJ SOLN
4.0000 mg | Freq: Once | INTRAMUSCULAR | Status: AC
  Administered 2021-04-29: 4 mg via INTRAVENOUS
  Filled 2021-04-29: qty 2

## 2021-04-29 MED ORDER — ONDANSETRON 4 MG PO TBDP: 4.0000 mg | ORAL_TABLET | Freq: Three times a day (TID) | ORAL | 0 refills | Status: DC | PRN

## 2021-04-29 MED ORDER — SODIUM CHLORIDE 0.9 % IV BOLUS
1000.0000 mL | Freq: Once | INTRAVENOUS | Status: AC
  Administered 2021-04-29: 1000 mL via INTRAVENOUS

## 2021-04-29 MED ORDER — POTASSIUM CHLORIDE CRYS ER 20 MEQ PO TBCR
40.0000 meq | EXTENDED_RELEASE_TABLET | Freq: Once | ORAL | Status: AC
  Administered 2021-04-29: 40 meq via ORAL
  Filled 2021-04-29: qty 2

## 2021-04-29 NOTE — Discharge Instructions (Addendum)
Please go to the hospital as soon as you leave urgent care for further evaluation and management. 

## 2021-04-29 NOTE — ED Provider Notes (Signed)
Patient's potassium was 3.0.  Patient given 1 run of IV potassium here.  Because of the history of the vomiting but not throwing up currently.  Also has some diarrhea.  And given 40 mill equivalents p.o.  Sent home with a prescription for potassium twice daily for the next 2 days.  Patient has Imodium at home and renewed her Zofran.  Patient feeling much better after the IV hydration.  Urinalysis had many bacteria but had no red blood cells or white blood cells.  Feel that this is not consistent with urinary tract infection.   Vanetta Mulders, MD 04/29/21 1737

## 2021-04-29 NOTE — ED Provider Notes (Addendum)
[facility]    CSN: [id number] Arrival date & time: 04/29/21  0805      History   Chief Complaint Chief Complaint  Patient presents with   vomiting, fever    HPI Deborah Dodson is a 45 y.o. female.   Patient presents with 3-day history of nausea, vomiting, diarrhea, fever, cough, nasal congestion.  T-max at home was 102.  Patient was seen in urgent care on 04/17/2021 for similar symptoms but states that those symptoms have resolved, and these are new symptoms.  Patient has had approximately 4-5 episodes of vomiting since symptoms started and diarrhea has been pretty consistent per patient.  Denies any blood in stool or emesis.  Patient has been able to drink fluids at times.  Has been taking ondansetron that was prescribed at previous urgent care visit with some relief of nausea.  Cough is nonproductive.  Denies any known sick contacts.  Denies chest pain or shortness of breath.  Denies abdominal pain.    Past Medical History:  Diagnosis Date   Arthritis    No pertinent past medical history     There are no problems to display for this patient.   Past Surgical History:  Procedure Laterality Date   APPENDECTOMY  03/2012   CESAREAN SECTION  12/26/06   CHOLECYSTECTOMY  03/2004   LAPAROSCOPIC APPENDECTOMY  03/30/2012   Procedure: APPENDECTOMY LAPAROSCOPIC;  Surgeon: Wilmon Arms. Corliss Skains, MD;  Location: [facility];  Service: General;  Laterality: N/A;    OB History   No obstetric history on file.      Home Medications    Prior to Admission medications   Medication Sig Start Date End Date Taking? Authorizing Provider  cephALEXin (KEFLEX) 500 MG capsule Take 1 capsule (500 mg total) by mouth 4 (four) times daily. 02/07/13   Vanetta Mulders, MD  cetirizine (ZYRTEC) 10 MG tablet cetirizine 10 mg tablet  Take 1 tablet every day by oral route in the morning.    [provider]  dicyclomine (BENTYL) 20 MG tablet Take 1 tablet (20 mg total) by mouth every 8  (eight) hours as needed for spasms (Abdominal cramping). 10/27/19   Ward, Layla Maw, DO  HYDROcodone-acetaminophen (NORCO/VICODIN) 5-325 MG per tablet Take 1-2 tablets by mouth every 6 (six) hours as needed for pain. 02/07/13   Vanetta Mulders, MD  ibuprofen (ADVIL,MOTRIN) 200 MG tablet Take 400 mg by mouth every 6 (six) hours as needed. Pain.    [provider]  ibuprofen (ADVIL,MOTRIN) 800 MG tablet Take 1 tablet (800 mg total) by mouth 3 (three) times daily. 02/07/13   Vanetta Mulders, MD  montelukast (SINGULAIR) 10 MG tablet montelukast 10 mg tablet  Take 1 tablet every day by oral route at bedtime.    [provider]  promethazine (PHENERGAN) 25 MG tablet Take 1 tablet (25 mg total) by mouth every 6 (six) hours as needed for nausea. 02/07/13   Vanetta Mulders, MD    Family History Family History  Problem Relation Age of Onset   Cancer Mother        breast   Diabetes Father    CAD Father    Cancer Sister        breast   Cancer Paternal Grandfather        lung    Social History Social History   Tobacco Use   Smoking status: Every Day    Packs/day: 0.50    Years: 17.00    Pack years: 8.50  Types: Cigarettes   Smokeless tobacco: Never   Tobacco comments:    6 cigs per day  Vaping Use   Vaping Use: Never used  Substance Use Topics   Alcohol use: No   Drug use: No     Allergies   Penicillins, Sulfa antibiotics, Ciprofloxacin, and Iodine   Review of Systems Review of Systems Per HPI  Physical Exam Triage Vital Signs ED Triage Vitals [04/29/21 0812]  Enc Vitals Group     BP (!) 157/79     Pulse Rate (!) 121     Resp 18     Temp 98 F (36.7 C)     Temp Source Oral     SpO2 97 %     Weight      Height      Head Circumference      Peak Flow      Pain Score 0     Pain Loc      Pain Edu?      Excl. in [place]?    No data found.  Updated Vital Signs BP (!) 157/79 (BP Location: Left Arm)   Pulse (!) 121   Temp 98 F (36.7 C) (Oral)    Resp 18   LMP 09/05/2014   SpO2 97%   Visual Acuity Right Eye Distance:   Left Eye Distance:   Bilateral Distance:    Right Eye Near:   Left Eye Near:    Bilateral Near:     Physical Exam Constitutional:      General: She is not in acute distress.    Appearance: Normal appearance. She is not toxic-appearing or diaphoretic.  HENT:     Head: Normocephalic and atraumatic.     Right Ear: Tympanic membrane and ear canal normal.     Left Ear: Tympanic membrane and ear canal normal.     Nose: Congestion present.     Mouth/Throat:     Mouth: Mucous membranes are moist.     Pharynx: No posterior oropharyngeal erythema.  Eyes:     Extraocular Movements: Extraocular movements intact.     Conjunctiva/sclera: Conjunctivae normal.     Pupils: Pupils are equal, round, and reactive to light.  Cardiovascular:     Rate and Rhythm: Regular rhythm. Tachycardia present.     Pulses: Normal pulses.     Heart sounds: Normal heart sounds.  Pulmonary:     Effort: Pulmonary effort is normal.     Breath sounds: Normal breath sounds.  Abdominal:     General: Bowel sounds are normal. There is no distension.     Palpations: Abdomen is soft.     Tenderness: There is no abdominal tenderness.  Skin:    General: Skin is warm and dry.  Neurological:     General: No focal deficit present.     Mental Status: She is alert and oriented to person, place, and time. Mental status is at baseline.  Psychiatric:        Mood and Affect: Mood normal.        Behavior: Behavior normal.        Thought Content: Thought content normal.        Judgment: Judgment normal.     UC Treatments / Results  Labs (all labs ordered are listed, but only abnormal results are displayed) Labs Reviewed  NOVEL CORONAVIRUS, NAA  COMPREHENSIVE METABOLIC PANEL  CBC  POCT INFLUENZA A/B    EKG   Radiology No results found.  Procedures Procedures (including  critical care time)  Medications Ordered in UC Medications - No  data to display  Initial Impression / Assessment and Plan / UC Course  I have reviewed the triage vital signs and the nursing notes.  Pertinent labs & imaging results that were available during my care of the patient were reviewed by me and considered in my medical decision making (see chart for details).     Rapid flu test is negative.  COVID-19 test is pending.  Suspect the patient has a viral cause to symptoms.  Attempted to get CMP and CBC to rule out more worrisome etiologies, but nursing staff was unsuccessful obtaining lab work.  Patient does have tachycardia and some signs of dehydration.  Since unable to obtain lab work and possible signs of dehydration, patient was advised to go to the hospital for further evaluation and management.  Patient was agreeable with plan.  Vital signs stable at discharge. Agree with patient self transport to the hospital. Ondansetron canceled at pharmacy.  Final Clinical Impressions(s) / UC Diagnoses   Final diagnoses:  Viral illness  Nausea vomiting and diarrhea  Acute cough  Flu-like symptoms  Encounter for laboratory testing for COVID-19 virus     Discharge Instructions      Please go to the hospital as soon as you leave urgent care for further evaluation and management.     ED Prescriptions     Medication Sig Dispense Auth. Provider   ondansetron (ZOFRAN-ODT) 4 MG disintegrating tablet  (Status: Discontinued) Take 1 tablet (4 mg total) by mouth every 8 (eight) hours as needed for nausea or vomiting. 5 tablet [place], Michele Rockers, [place]      PDMP not reviewed this encounter.   Teodora Medici, [place] 04/29/21 [place], [place], [place] 04/29/21 [phone]

## 2021-04-29 NOTE — ED Triage Notes (Signed)
Arrived from [facility] with c/o vomiting (last vomiting episode was this am), having episodes of diarrhea and body aches.

## 2021-04-29 NOTE — ED Notes (Signed)
ED Provider at bedside to review results 

## 2021-04-29 NOTE — Discharge Instructions (Addendum)
Continue your Imodium.  Continue the Zofran.  New prescription provided.  Take the potassium supplement 2tablets twice a day for the next 2 days.  Make an appointment follow-up with your regular doctor.

## 2021-04-29 NOTE — ED Triage Notes (Signed)
Pt c/o vomiting, diarrhea, cough, headache, body aches and chills, fever (102F at home).  Denies sore throat   Onset last Thursday

## 2021-04-29 NOTE — ED Provider Notes (Signed)
[facility] Provider Note   CSN: [id number] Arrival date & time: 04/29/21  1022     History Chief Complaint  Patient presents with   Dehydration    Deborah Dodson is a 45 y.o. female.  Presents to ER with chief complaint of dehydration.  Patient reports that over the last 3 days she has been having nausea, vomiting, diarrhea, fever.  This has been associated with mild cough and congestion.  Fever up to 102.  No blood in stool, no blood in vomit.  No abdominal pain.  Went to urgent care this morning and was recommended that she go to the ER to get hydrated and have her blood work checked.  No dysuria or hematuria.  No flank pain.  HPI     Past Medical History:  Diagnosis Date   Arthritis    No pertinent past medical history     There are no problems to display for this patient.   Past Surgical History:  Procedure Laterality Date   APPENDECTOMY  03/2012   CESAREAN SECTION  12/26/06   CHOLECYSTECTOMY  03/2004   LAPAROSCOPIC APPENDECTOMY  03/30/2012   Procedure: APPENDECTOMY LAPAROSCOPIC;  Surgeon: Imogene Burn. Georgette Dover, MD;  Location: [facility];  Service: General;  Laterality: N/A;     OB History   No obstetric history on file.     Family History  Problem Relation Age of Onset   Cancer Mother        breast   Diabetes Father    CAD Father    Cancer Sister        breast   Cancer Paternal Grandfather        lung    Social History   Tobacco Use   Smoking status: Every Day    Packs/day: 0.50    Years: 17.00    Pack years: 8.50    Types: Cigarettes   Smokeless tobacco: Never   Tobacco comments:    6 cigs per day  Vaping Use   Vaping Use: Never used  Substance Use Topics   Alcohol use: No   Drug use: No    Home Medications Prior to Admission medications   Medication Sig Start Date End Date Taking? Authorizing Provider  ondansetron (ZOFRAN ODT) 4 MG disintegrating tablet Take 1 tablet (4 mg total) by mouth every 8 (eight) hours  as needed for nausea or vomiting. 04/29/21  Yes Fredia Sorrow, MD  potassium chloride SA (KLOR-CON) 20 MEQ tablet Take 1 tablet (20 mEq total) by mouth 2 (two) times daily. 04/29/21  Yes Fredia Sorrow, MD  cephALEXin (KEFLEX) 500 MG capsule Take 1 capsule (500 mg total) by mouth 4 (four) times daily. 02/07/13   Fredia Sorrow, MD  cetirizine (ZYRTEC) 10 MG tablet cetirizine 10 mg tablet  Take 1 tablet every day by oral route in the morning.    [provider]  dicyclomine (BENTYL) 20 MG tablet Take 1 tablet (20 mg total) by mouth every 8 (eight) hours as needed for spasms (Abdominal cramping). 10/27/19   Ward, Delice Bison, DO  HYDROcodone-acetaminophen (NORCO/VICODIN) 5-325 MG per tablet Take 1-2 tablets by mouth every 6 (six) hours as needed for pain. 02/07/13   Fredia Sorrow, MD  ibuprofen (ADVIL,MOTRIN) 200 MG tablet Take 400 mg by mouth every 6 (six) hours as needed. Pain.    [provider]  ibuprofen (ADVIL,MOTRIN) 800 MG tablet Take 1 tablet (800 mg total) by mouth 3 (three) times daily. 02/07/13   Fredia Sorrow,  MD  montelukast (SINGULAIR) 10 MG tablet montelukast 10 mg tablet  Take 1 tablet every day by oral route at bedtime.    [provider]  promethazine (PHENERGAN) 25 MG tablet Take 1 tablet (25 mg total) by mouth every 6 (six) hours as needed for nausea. 02/07/13   Vanetta Mulders, MD    Allergies    Penicillins, Sulfa antibiotics, Ciprofloxacin, and Iodine  Review of Systems   Review of Systems  Constitutional:  Positive for chills, fatigue and fever.  HENT:  Positive for congestion. Negative for ear pain and sore throat.   Eyes:  Negative for pain and visual disturbance.  Respiratory:  Positive for cough. Negative for shortness of breath.   Cardiovascular:  Negative for chest pain and palpitations.  Gastrointestinal:  Positive for diarrhea, nausea and vomiting. Negative for abdominal pain.  Genitourinary:  Negative for dysuria and hematuria.   Musculoskeletal:  Negative for arthralgias and back pain.  Skin:  Negative for color change and rash.  Neurological:  Negative for seizures and syncope.  All other systems reviewed and are negative.  Physical Exam Updated Vital Signs BP (!) 141/77 (BP Location: Left Arm)   Pulse 87   Temp 98.2 F (36.8 C) (Oral)   Resp 17   Ht 5\' 5"  (1.651 m)   Wt 81.6 kg   LMP 09/05/2014   SpO2 98%   BMI 29.95 kg/m   Physical Exam Vitals and nursing note reviewed.  Constitutional:      General: She is not in acute distress.    Appearance: She is well-developed.  HENT:     Head: Normocephalic and atraumatic.  Eyes:     Conjunctiva/sclera: Conjunctivae normal.  Cardiovascular:     Rate and Rhythm: Normal rate and regular rhythm.     Heart sounds: No murmur heard. Pulmonary:     Effort: Pulmonary effort is normal. No respiratory distress.     Breath sounds: Normal breath sounds.  Abdominal:     Palpations: Abdomen is soft.     Tenderness: There is no abdominal tenderness.  Musculoskeletal:     Cervical back: Neck supple.  Skin:    General: Skin is warm and dry.  Neurological:     General: No focal deficit present.     Mental Status: She is alert.    ED Results / Procedures / Treatments   Labs (all labs ordered are listed, but only abnormal results are displayed) Labs Reviewed  CBC - Abnormal; Notable for the following components:      Result Value   WBC 11.0 (*)    RBC 5.45 (*)    Hemoglobin 15.8 (*)    HCT 47.1 (*)    Platelets 404 (*)    All other components within normal limits  URINALYSIS, ROUTINE W REFLEX MICROSCOPIC - Abnormal; Notable for the following components:   Hgb urine dipstick TRACE (*)    Protein, ur 30 (*)    All other components within normal limits  COMPREHENSIVE METABOLIC PANEL - Abnormal; Notable for the following components:   Potassium 3.0 (*)    Glucose, Bld 100 (*)    Calcium 8.6 (*)    All other components within normal limits  URINALYSIS,  MICROSCOPIC (REFLEX) - Abnormal; Notable for the following components:   Bacteria, UA MANY (*)    All other components within normal limits  PREGNANCY, URINE  LIPASE, BLOOD    EKG None  Radiology No results found.  Procedures Procedures   Medications Ordered in  ED Medications  sodium chloride 0.9 % bolus 1,000 mL (0 mLs Intravenous Stopped 04/29/21 1335)  potassium chloride 10 mEq in 100 mL IVPB (0 mEq Intravenous Stopped 04/29/21 1824)  potassium chloride SA (KLOR-CON) CR tablet 40 mEq (40 mEq Oral Given 04/29/21 1716)  ondansetron (ZOFRAN) injection 4 mg (4 mg Intravenous Given 04/29/21 1717)    ED Course  I have reviewed the triage vital signs and the nursing notes.  Pertinent labs & imaging results that were available during my care of the patient were reviewed by me and considered in my medical decision making (see chart for details).    MDM Rules/Calculators/A&P                          45 year old presents to ER with concern for nausea, vomiting, diarrhea, fever, cough and congestion.  On physical exam she appears quite well in no distress.  Abdomen is soft and benign.  Lungs are clear to auscultation.  Suspect most likely viral gastroenteritis.  Ordered patient fluids, basic labs.  While awaiting blood work to results, signed out to Dr. Rogene Houston.  Anticipate likely discharge home if kidney function and electrolytes grossly stable.  Final Clinical Impression(s) / ED Diagnoses Final diagnoses:  Gastroenteritis  Dehydration  Hypokalemia    Rx / DC Orders ED Discharge Orders          Ordered    potassium chloride SA (KLOR-CON) 20 MEQ tablet  2 times daily        04/29/21 1736    ondansetron (ZOFRAN ODT) 4 MG disintegrating tablet  Every 8 hours PRN        04/29/21 1736             Lucrezia Starch, MD 04/30/21 1647

## 2021-04-29 NOTE — ED Notes (Signed)
ED Provider at bedside. 

## 2021-04-30 LAB — SARS-COV-2, NAA 2 DAY TAT

## 2021-04-30 LAB — NOVEL CORONAVIRUS, NAA: SARS-CoV-2, NAA: NOT DETECTED

## 2021-06-29 DIAGNOSIS — Z87891 Personal history of nicotine dependence: Secondary | ICD-10-CM | POA: Insufficient documentation

## 2021-07-05 ENCOUNTER — Other Ambulatory Visit: Payer: Self-pay | Admitting: Physician Assistant

## 2021-07-05 DIAGNOSIS — Z1239 Encounter for other screening for malignant neoplasm of breast: Secondary | ICD-10-CM

## 2021-07-31 ENCOUNTER — Emergency Department (HOSPITAL_COMMUNITY): Payer: 59

## 2021-07-31 ENCOUNTER — Emergency Department (HOSPITAL_COMMUNITY)
Admission: EM | Admit: 2021-07-31 | Discharge: 2021-07-31 | Disposition: A | Discharge status: Home / Self Care | Payer: 59 | Attending: Emergency Medicine | Admitting: Emergency Medicine

## 2021-07-31 DIAGNOSIS — R2243 Localized swelling, mass and lump, lower limb, bilateral: Secondary | ICD-10-CM | POA: Insufficient documentation

## 2021-07-31 DIAGNOSIS — R079 Chest pain, unspecified: Secondary | ICD-10-CM | POA: Insufficient documentation

## 2021-07-31 LAB — BASIC METABOLIC PANEL
Anion gap: 12 (ref 5–15)
BUN: 6 mg/dL (ref 6–20)
CO2: 27 mmol/L (ref 22–32)
Calcium: 9.4 mg/dL (ref 8.9–10.3)
Chloride: 102 mmol/L (ref 98–111)
Creatinine, Ser: 0.7 mg/dL (ref 0.44–1.00)
GFR, Estimated: >60 mL/min (ref >60)
Glucose, Bld: 117 mg/dL — ABNORMAL HIGH (ref 70–99)
Potassium: 3.3 mmol/L — ABNORMAL LOW (ref 3.5–5.1)
Sodium: 141 mmol/L (ref 135–145)

## 2021-07-31 LAB — CBC WITH DIFFERENTIAL/PLATELET
Abs Immature Granulocytes: 0.21 10*3/uL — ABNORMAL HIGH (ref 0.00–0.07)
Basophils Absolute: 0.1 10*3/uL (ref 0.0–0.1)
Basophils Relative: 0 %
Eosinophils Absolute: 0 10*3/uL (ref 0.0–0.5)
Eosinophils Relative: 0 %
HCT: 37.9 % (ref 36.0–46.0)
Hemoglobin: 12.6 g/dL (ref 12.0–15.0)
Immature Granulocytes: 2 %
Lymphocytes Relative: 11 %
Lymphs Abs: 1.4 10*3/uL (ref 0.7–4.0)
MCH: 29.6 pg (ref 26.0–34.0)
MCHC: 33.2 g/dL (ref 30.0–36.0)
MCV: 89.2 fL (ref 80.0–100.0)
Monocytes Absolute: 0.4 10*3/uL (ref 0.1–1.0)
Monocytes Relative: 3 %
Neutro Abs: 10.5 10*3/uL — ABNORMAL HIGH (ref 1.7–7.7)
Neutrophils Relative %: 84 %
Platelets: 300 10*3/uL (ref 150–400)
RBC: 4.25 MIL/uL (ref 3.87–5.11)
RDW: 13.4 % (ref 11.5–15.5)
WBC: 12.5 10*3/uL — ABNORMAL HIGH (ref 4.0–10.5)
nRBC: 0 % (ref 0.0–0.2)

## 2021-07-31 LAB — TROPONIN I (HIGH SENSITIVITY)
Troponin I (High Sensitivity): 8 ng/L (ref <18)
Troponin I (High Sensitivity): 6 ng/L (ref <18)

## 2021-07-31 MED ORDER — ACETAMINOPHEN 500 MG PO TABS
1000.0000 mg | ORAL_TABLET | Freq: Four times a day (QID) | ORAL | Status: DC | PRN
  Filled 2021-07-31: qty 2

## 2021-07-31 NOTE — ED Provider Triage Note (Signed)
Emergency Medicine Provider Triage Evaluation Note  Deborah Dodson , a 46 y.o. female  was evaluated in triage.  Pt complains of chest pain.  Woke up about 1 hour ago with it.  Central chest radiating into both arms and legs.  Reports nausea without vomiting.  No known cardiac hx, former smoker.  Dad has heart disease.   Review of Systems  Positive: Chest pain, nausea Negative: fever  Physical Exam  BP (!) 156/75 (BP Location: Left Arm)    Pulse 91    Temp 98.2 F (36.8 C) (Oral)    Resp 17    LMP 09/05/2014    SpO2 100%   Gen:   Awake, no distress, hyperventilating in triage, seems anxious Resp:  Normal effort  MSK:   Moves extremities without difficulty  Other:    Medical Decision Making  Medically screening exam initiated at 1:22 AM.  Appropriate orders placed.  Deborah Dodson was informed that the remainder of the evaluation will be completed by another provider, this initial triage assessment does not replace that evaluation, and the importance of remaining in the ED until their evaluation is complete.  Chest pain.   VSS without tachycardia or hypoxia.  EKG, labs, CXR.   Larene Pickett, PA-C 07/31/21 0124

## 2021-07-31 NOTE — ED Notes (Signed)
Pt verbalized understanding of d/c instructions, meds and followup care. Denies questions. VSS, no distress noted. Steady gait to exit with all belongings.  ?

## 2021-07-31 NOTE — ED Triage Notes (Signed)
Pt c/o sudden onset CP, associated nausea but no emesis. Former smoker. Pt tachypneic in triage, appears very anxious. No personal hx, dad had MI, sister has "heart trouble."

## 2021-07-31 NOTE — Discharge Instructions (Addendum)
Your work-up today was very reassuring.  Your x-ray was negative for infection or changes in your heart size.  The enzyme we test to see if your heart is damaged was normal.  EKG was reassuring.  Your white count was slightly elevated, however you did test positive for flu last week so this is likely residual from that.  Overall, based on the symptoms you describe I suspect that this may be a flareup from your RA or an autoimmune disorder.  I recommend that you follow-up with your primary care doctor and obtain a referral to a rheumatologist may be able to better manage your symptoms long-term.

## 2021-07-31 NOTE — ED Provider Notes (Signed)
[name] EMERGENCY DEPARTMENT Provider Note   CSN: [id number] Arrival date & time: 07/31/21  0106     History  Chief Complaint  Patient presents with   Chest Pain    Deborah Dodson is a 46 y.o. female with history of RA who presents to the ED for evaluation of chest pain that started last night and lasted approximately 2 hours.  Patient states pain felt like burning/sharp and felt it up by her collarbone and it radiated down her bilateral legs and down to her bilateral knees.  EMS recommended 81 mg aspirin in route to the ED.  Patient states pain mostly resolved aside from the bilateral knee pain.  She states that her pain from RA does affect her knees, but has never felt like this before. She denies headache, shortness of breath, abdominal pain, nausea, vomiting and diarrhea   Chest Pain Associated symptoms: no abdominal pain, no fever, no headache, no shortness of breath and no vomiting       Home Medications Prior to Admission medications   Medication Sig Start Date End Date Taking? Authorizing Provider  cetirizine (ZYRTEC) 10 MG tablet Take 10 mg by mouth daily.    [provider]  dicyclomine (BENTYL) 20 MG tablet Take 1 tablet (20 mg total) by mouth every 8 (eight) hours as needed for spasms (Abdominal cramping). 10/27/19   Ward, Layla Maw, DO  ibuprofen (ADVIL,MOTRIN) 200 MG tablet Take 400 mg by mouth every 6 (six) hours as needed. Pain.    [provider]  montelukast (SINGULAIR) 10 MG tablet Take 10 mg by mouth at bedtime.    [provider]  ondansetron (ZOFRAN ODT) 4 MG disintegrating tablet Take 1 tablet (4 mg total) by mouth every 8 (eight) hours as needed for nausea or vomiting. 04/29/21   Vanetta Mulders, MD  potassium chloride SA (KLOR-CON) 20 MEQ tablet Take 1 tablet (20 mEq total) by mouth 2 (two) times daily. 04/29/21   Vanetta Mulders, MD      Allergies    Penicillins, Sulfa antibiotics, Ciprofloxacin, and Iodine     Review of Systems   Review of Systems  Constitutional:  Negative for fever.  HENT: Negative.    Eyes: Negative.   Respiratory:  Negative for shortness of breath.   Cardiovascular:  Positive for chest pain.  Gastrointestinal:  Negative for abdominal pain and vomiting.  Endocrine: Negative.   Genitourinary: Negative.   Musculoskeletal:  Positive for arthralgias.  Skin:  Negative for rash.  Neurological:  Negative for headaches.  All other systems reviewed and are negative.  Physical Exam Updated Vital Signs BP 129/76 (BP Location: Left Arm)    Pulse 83    Temp 97.9 F (36.6 C) (Oral)    Resp 16    LMP 09/05/2014    SpO2 98%  Physical Exam Vitals and nursing note reviewed.  Constitutional:      General: She is not in acute distress.    Appearance: She is not ill-appearing.  HENT:     Head: Atraumatic.  Eyes:     Conjunctiva/sclera: Conjunctivae normal.  Cardiovascular:     Rate and Rhythm: Normal rate and regular rhythm.     Pulses: Normal pulses.          Radial pulses are 2+ on the right side and 2+ on the left side.       Dorsalis pedis pulses are 2+ on the right side and 2+ on the left side.  Heart sounds: No murmur heard. Pulmonary:     Effort: Pulmonary effort is normal. No respiratory distress.     Breath sounds: Normal breath sounds.  Abdominal:     General: Abdomen is flat. There is no distension.     Palpations: Abdomen is soft.     Tenderness: There is no abdominal tenderness.  Musculoskeletal:        General: Normal range of motion.     Cervical back: Normal range of motion.     Comments: Mild swelling to the knees bilaterally.  No ecchymosis, erythema or induration.  Mild pain to palpation, pain noted during ROM, however full ROM was intact.  Skin:    General: Skin is warm and dry.     Capillary Refill: Capillary refill takes less than 2 seconds.  Neurological:     General: No focal deficit present.     Mental Status: She is alert.  Psychiatric:         Mood and Affect: Mood normal.    ED Results / Procedures / Treatments   Labs (all labs ordered are listed, but only abnormal results are displayed) Labs Reviewed  CBC WITH DIFFERENTIAL/PLATELET - Abnormal; Notable for the following components:      Result Value   WBC 12.5 (*)    Neutro Abs 10.5 (*)    Abs Immature Granulocytes 0.21 (*)    All other components within normal limits  BASIC METABOLIC PANEL - Abnormal; Notable for the following components:   Potassium 3.3 (*)    Glucose, Bld 117 (*)    All other components within normal limits  TROPONIN I (HIGH SENSITIVITY)  TROPONIN I (HIGH SENSITIVITY)    EKG EKG Interpretation  Date/Time:  Tuesday July 31 2021 01:20:06 EST Ventricular Rate:  96 PR Interval:  156 QRS Duration: 90 QT Interval:  352 QTC Calculation: 444 R Axis:   72 Text Interpretation: Normal sinus rhythm Low voltage QRS Cannot rule out Anterior infarct , age undetermined Abnormal ECG No previous ECGs available Confirmed by Dione Booze ([id number]) on 07/31/2021 6:15:36 AM  Radiology DG Chest 2 View  Result Date: 07/31/2021 CLINICAL DATA:  Chest pain EXAM: CHEST - 2 VIEW COMPARISON:  None. FINDINGS: The heart size and mediastinal contours are within normal limits. Both lungs are clear. The visualized skeletal structures are unremarkable. IMPRESSION: No active cardiopulmonary disease. Electronically Signed   By: Burman Nieves M.D.   On: 07/31/2021 01:43    Procedures Procedures    Medications Ordered in ED Medications  acetaminophen (TYLENOL) tablet 1,000 mg (has no administration in time range)    ED Course/ Medical Decision Making/ A&P                           Medical Decision Making Risk OTC drugs.   History:  Per HPI  Initial impression/ED course:  This patient presents to the ED for concern of chest pain, this involves an extensive number of treatment options, and is a complaint that carries with it a high risk of complications and  morbidity.   On exam, as patient's vitals are stable, she is in no acute distress and nontoxic-appearing.  Physical exam was over all unremarkable aside from mild bilateral knee swelling without signs of infection or bruising.  Lab works obtained in triage were overall very reassuring with normal troponins, BMP normal.  Patient elevated white count, however given her RA and her recent diagnosis of flu, this is likely  residual from that.  Chest x-ray without signs of infection or cardiomegaly.  EKG without arrhythmia or signs of MI.    Lab Tests and EKG:  I Ordered, reviewed, and interpreted labs and EKG.  The pertinent results are listed in the ED course above   Imaging Studies ordered:  I ordered imaging studies including chest x-ray I independently visualized and interpreted imaging and I agree with the radiologist interpretation.     Medicines ordered and prescription drug management:  I ordered medication including: Tylenol 1000 mg for headache Reevaluation of the patient after these medicines showed that the patient resolved I have reviewed the patients home medicines and have made adjustments as needed   Disposition:  After consideration of the diagnostic results, physical exam, history and the patients response to treatment feel that the patent would benefit from discharge with outpatient follow-up.   Nonspecific chest pain: Given the nature of her symptoms that include the bilateral upper and lower extremities and her history of RA, I suspect that this is possibly autoimmune or related to her RA.  I recommend that patient follow-up with a rheumatologist.  I offered her referral, but she would rather obtain one from her primary care doctor who she has an appointment with tomorrow.  Reassured her that her heart and lungs and remaining of exam were normal.  Patient is understanding and amenable to plan.  Discharged home in good condition.   Final Clinical Impression(s) / ED  Diagnoses Final diagnoses:  Nonspecific chest pain    Rx / DC Orders ED Discharge Orders     None         Janell Quiet, PA-C 07/31/21 1858    Charlynne Pander, MD 08/02/21 1325

## 2021-08-02 DIAGNOSIS — M069 Rheumatoid arthritis, unspecified: Secondary | ICD-10-CM | POA: Insufficient documentation

## 2021-09-09 ENCOUNTER — Other Ambulatory Visit: Payer: Self-pay

## 2021-09-09 ENCOUNTER — Ambulatory Visit
Admission: EM | Admit: 2021-09-09 | Discharge: 2021-09-09 | Disposition: A | Discharge status: Home / Self Care | Payer: 59 | Attending: Physician Assistant | Admitting: Physician Assistant

## 2021-09-09 DIAGNOSIS — H1032 Unspecified acute conjunctivitis, left eye: Secondary | ICD-10-CM

## 2021-09-09 DIAGNOSIS — H01001 Unspecified blepharitis right upper eyelid: Secondary | ICD-10-CM | POA: Diagnosis not present

## 2021-09-09 DIAGNOSIS — H01004 Unspecified blepharitis left upper eyelid: Secondary | ICD-10-CM

## 2021-09-09 DIAGNOSIS — J209 Acute bronchitis, unspecified: Secondary | ICD-10-CM | POA: Diagnosis not present

## 2021-09-09 MED ORDER — PREDNISONE 20 MG PO TABS: 40.0000 mg | ORAL_TABLET | Freq: Every day | ORAL | 0 refills | Status: AC

## 2021-09-09 MED ORDER — ERYTHROMYCIN 5 MG/GM OP OINT: TOPICAL_OINTMENT | OPHTHALMIC | 0 refills | Status: DC

## 2021-09-09 NOTE — ED Provider Notes (Signed)
?[facility] ? ? ? ?CSN: [id number] ?Arrival date & time: 09/09/21  0840 ? ? ?  ? ?History   ?Chief Complaint ?Chief Complaint  ?Patient presents with  ? Cough  ? ? ?HPI ?Deborah Dodson is a 46 y.o. female.  ? ?Patient here today for evaluation of swelling of bilateral eyelids as well as mild redness to her eyes and drainage that started yesterday.  She states she was seen by her PCP about a week ago and was diagnosed with upper respiratory infection but her cough continues to linger as well.  She has not had any fever.  She does not report any treatment for her eye symptoms. ? ?The history is provided by the patient.  ? ?Past Medical History:  ?Diagnosis Date  ? Arthritis   ? No pertinent past medical history   ? ? ?There are no problems to display for this patient. ? ? ?Past Surgical History:  ?Procedure Laterality Date  ? APPENDECTOMY  03/2012  ? CESAREAN SECTION  12/26/06  ? CHOLECYSTECTOMY  03/2004  ? LAPAROSCOPIC APPENDECTOMY  03/30/2012  ? Procedure: APPENDECTOMY LAPAROSCOPIC;  Surgeon: Wilmon ArmsMatthew K. Corliss Skainssuei, MD;  Location: [facility];  Service: General;  Laterality: N/A;  ? ? ?OB History   ?No obstetric history on file. ?  ? ? ? ?Home Medications   ? ?Prior to Admission medications   ?Medication Sig Start Date End Date Taking? Authorizing Provider  ?erythromycin ophthalmic ointment Place a 1/2 inch ribbon of ointment into the lower eyelid. 09/09/21  Yes Tomi BambergerMyers, Madie Cahn F, PA-C  ?predniSONE (DELTASONE) 20 MG tablet Take 2 tablets (40 mg total) by mouth daily with breakfast for 5 days. 09/09/21 09/14/21 Yes Tomi BambergerMyers, Garwood Wentzell F, PA-C  ?cetirizine (ZYRTEC) 10 MG tablet Take 10 mg by mouth daily.    [provider]  ?dicyclomine (BENTYL) 20 MG tablet Take 1 tablet (20 mg total) by mouth every 8 (eight) hours as needed for spasms (Abdominal cramping). 10/27/19   Ward, Layla MawKristen N, DO  ?ibuprofen (ADVIL,MOTRIN) 200 MG tablet Take 400 mg by mouth every 6 (six) hours as needed. Pain.    [provider]   ?montelukast (SINGULAIR) 10 MG tablet Take 10 mg by mouth at bedtime.    [provider]  ?ondansetron (ZOFRAN ODT) 4 MG disintegrating tablet Take 1 tablet (4 mg total) by mouth every 8 (eight) hours as needed for nausea or vomiting. 04/29/21   Vanetta MuldersZackowski, Scott, MD  ?potassium chloride SA (KLOR-CON) 20 MEQ tablet Take 1 tablet (20 mEq total) by mouth 2 (two) times daily. 04/29/21   Vanetta MuldersZackowski, Scott, MD  ? ? ?Family History ?Family History  ?Problem Relation Age of Onset  ? Cancer Mother   ?     breast  ? Diabetes Father   ? CAD Father   ? Cancer Sister   ?     breast  ? Cancer Paternal Grandfather   ?     lung  ? ? ?Social History ?Social History  ? ?Tobacco Use  ? Smoking status: Every Day  ?  Packs/day: 0.50  ?  Years: 17.00  ?  Pack years: 8.50  ?  Types: Cigarettes  ? Smokeless tobacco: Never  ? Tobacco comments:  ?  6 cigs per day  ?Vaping Use  ? Vaping Use: Never used  ?Substance Use Topics  ? Alcohol use: No  ? Drug use: No  ? ? ? ?Allergies   ?Penicillins, Sulfa antibiotics, Ciprofloxacin, and Iodine ? ? ?Review of Systems ?  Review of Systems  ?Constitutional:  Negative for chills and fever.  ?HENT:  Positive for congestion and rhinorrhea.   ?Eyes:  Positive for discharge and redness.  ?Respiratory:  Positive for cough. Negative for shortness of breath.   ?Gastrointestinal:  Negative for abdominal pain, nausea and vomiting.  ? ? ?Physical Exam ?Triage Vital Signs ?ED Triage Vitals  ?Enc Vitals Group  ?   BP   ?   Pulse   ?   Resp   ?   Temp   ?   Temp src   ?   SpO2   ?   Weight   ?   Height   ?   Head Circumference   ?   Peak Flow   ?   Pain Score   ?   Pain Loc   ?   Pain Edu?   ?   Excl. in GC?   ? ?No data found. ? ?Updated Vital Signs ?BP 131/77 (BP Location: Left Arm)   Pulse 86   Temp 97.8 ?F (36.6 ?C) (Oral)   Resp 18   LMP 09/05/2014   SpO2 96%  ?   ? ?Physical Exam ?Vitals and nursing note reviewed.  ?Constitutional:   ?   General: She is not in acute distress. ?   Appearance: Normal  appearance. She is not ill-appearing.  ?HENT:  ?   Head: Normocephalic and atraumatic.  ?Eyes:  ?   Extraocular Movements: Extraocular movements intact.  ?   Pupils: Pupils are equal, round, and reactive to light.  ?   Comments: Mild injection to left conjunctiva, crusting noted to left lower lashes, bilateral upper eyelids with swelling, no erythema  ?Cardiovascular:  ?   Rate and Rhythm: Normal rate and regular rhythm.  ?   Heart sounds: Normal heart sounds. No murmur heard. ?Pulmonary:  ?   Effort: Pulmonary effort is normal. No respiratory distress.  ?   Breath sounds: Normal breath sounds. No wheezing, rhonchi or rales.  ?Neurological:  ?   Mental Status: She is alert.  ?Psychiatric:     ?   Mood and Affect: Mood normal.     ?   Behavior: Behavior normal.     ?   Thought Content: Thought content normal.  ? ? ? ?UC Treatments / Results  ?Labs ?(all labs ordered are listed, but only abnormal results are displayed) ?Labs Reviewed - No data to display ? ?EKG ? ? ?Radiology ?No results found. ? ?Procedures ?Procedures (including critical care time) ? ?Medications Ordered in UC ?Medications - No data to display ? ?Initial Impression / Assessment and Plan / UC Course  ?I have reviewed the triage vital signs and the nursing notes. ? ?Pertinent labs & imaging results that were available during my care of the patient were reviewed by me and considered in my medical decision making (see chart for details). ? ?  ?Will treat to cover bronchitis given lingering cough. Prednisone will hopefully also help with decreased eyelid swelling and discomfort. Antibiotic ointment prescribed to cover conjunctivitis. Encouraged follow up if symptoms do not improve or worsen.  ? ?Final Clinical Impressions(s) / UC Diagnoses  ? ?Final diagnoses:  ?Acute bronchitis, unspecified organism  ?Acute conjunctivitis of left eye, unspecified acute conjunctivitis type  ?Blepharitis of upper eyelids of both eyes, unspecified type  ? ?Discharge  Instructions   ?None ?  ? ?ED Prescriptions   ? ? Medication Sig Dispense Auth. Provider  ? erythromycin ophthalmic ointment Place a  1/2 inch ribbon of ointment into the lower eyelid. 3.5 g Tomi Bamberger, PA-C  ? predniSONE (DELTASONE) 20 MG tablet Take 2 tablets (40 mg total) by mouth daily with breakfast for 5 days. 10 tablet Tomi Bamberger, PA-C  ? ?  ? ?PDMP not reviewed this encounter. ?  ?Tomi Bamberger, PA-C ?09/09/21 [id number] ? ?

## 2021-09-09 NOTE — ED Triage Notes (Signed)
Pt c/o orbital edema with associated erythema and discharge onset ~ last Monday. States pcp dx w/ viral URI. Also reports cough.  ?

## 2022-01-28 IMAGING — CT CT ABD-PELV W/ CM
2 of 5 series · 17 of 46 positions shown, 19 images · IV contrast (Omnipaque)
Comparison: 02/07/2013

CLINICAL DATA: Low abdominal pain for 2 days

EXAM:
CT ABDOMEN AND PELVIS WITH CONTRAST
TECHNIQUE: Multidetector CT imaging of the abdomen and pelvis was performed
using the standard protocol following bolus administration of
intravenous contrast.
CONTRAST:  100mL OMNIPAQUE IOHEXOL 300 MG/ML  SOLN

[Series 2: axial st · axial · 0.90mm/px · z∈[-451,-26]mm · 14 of 97 slices shown, 16 images]
[im 6/97  soft-tissue]
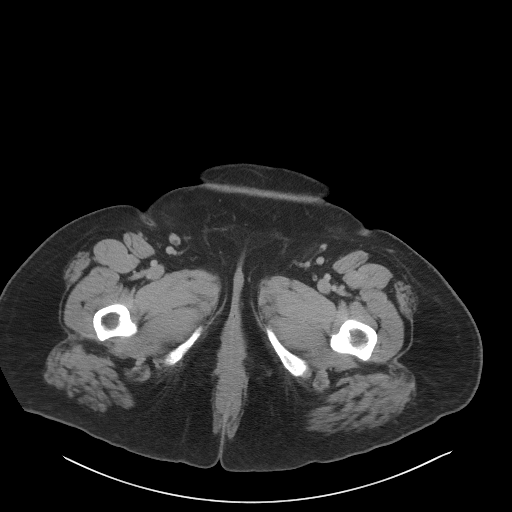
[im 6/97  bone]
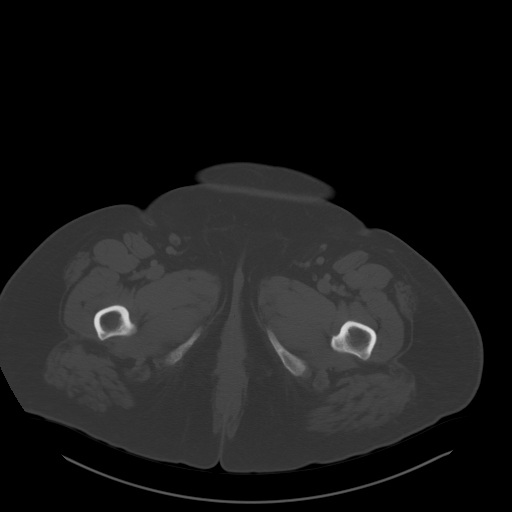
[im 11/97  soft-tissue]
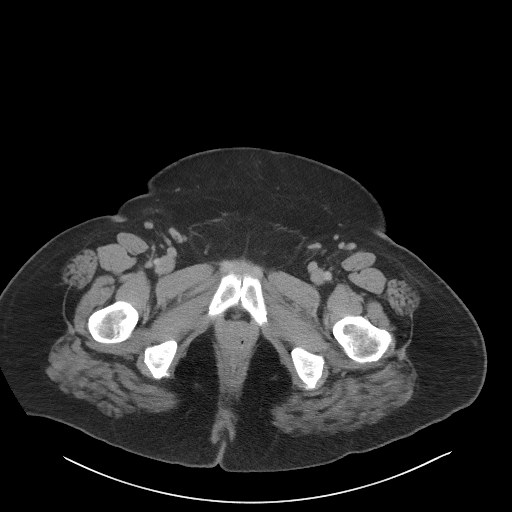
[im 22/97  soft-tissue]
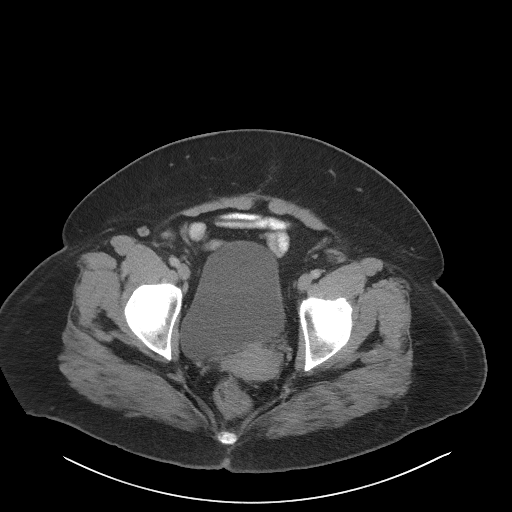
[im 27/97  soft-tissue]
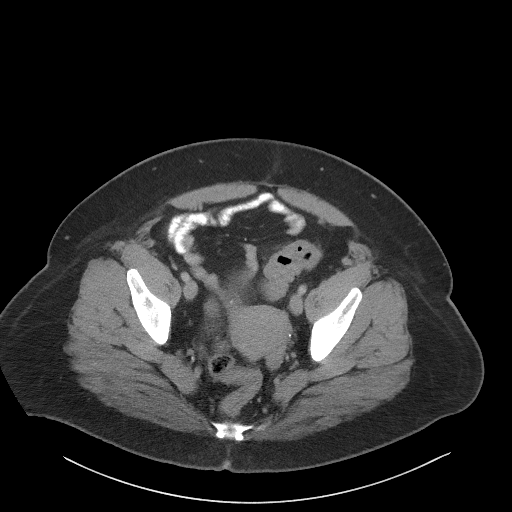
[im 33/97  soft-tissue]
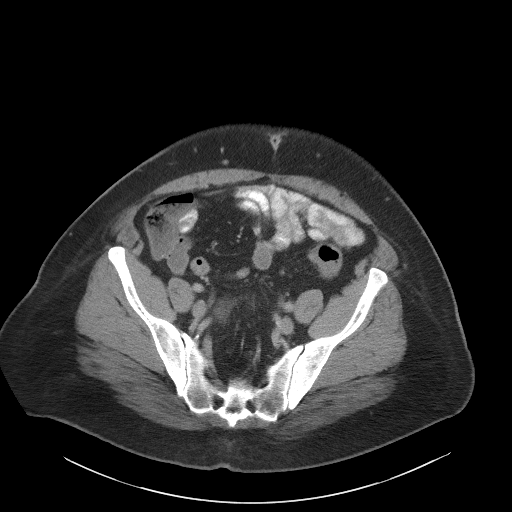
[im 38/97  soft-tissue]
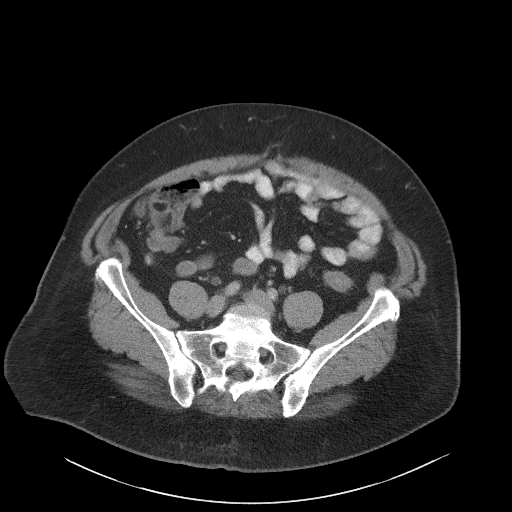
[im 43/97  soft-tissue]
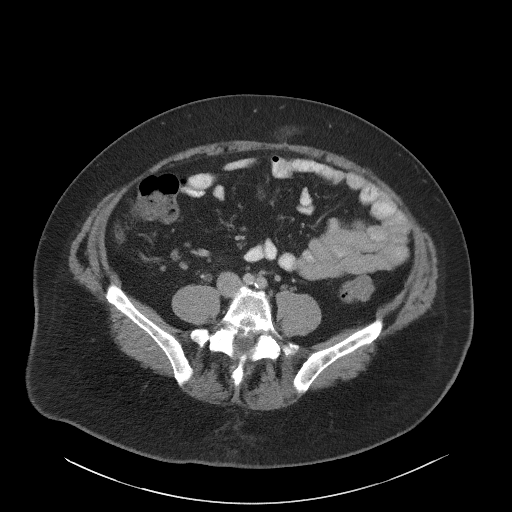
[im 54/97  soft-tissue]
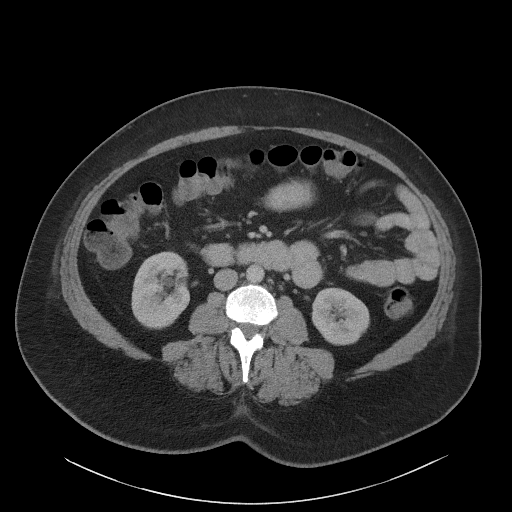
[im 59/97  soft-tissue]
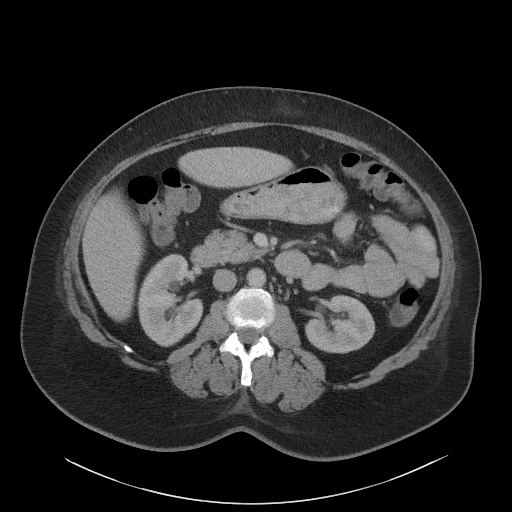
[im 59/97  bone]
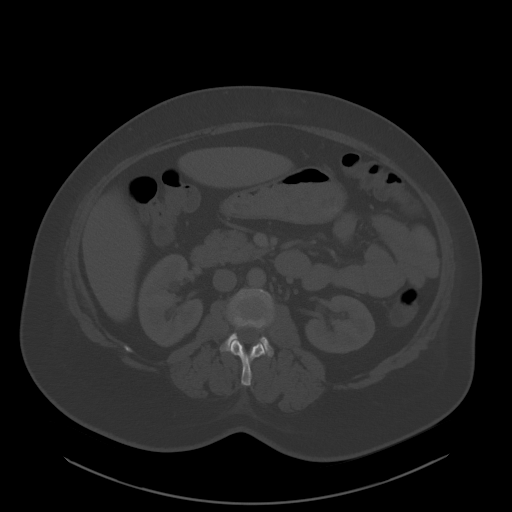
[im 65/97  soft-tissue]
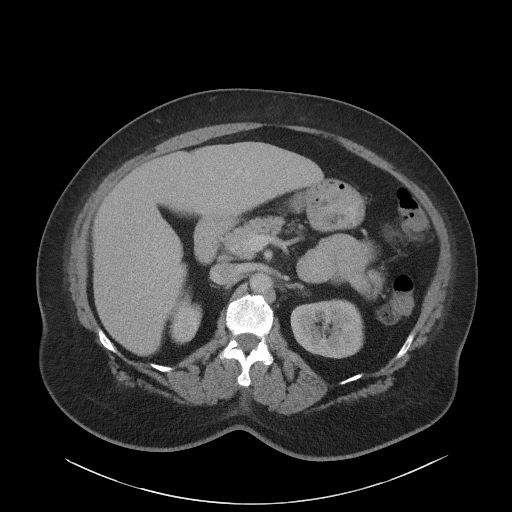
[im 70/97  soft-tissue]
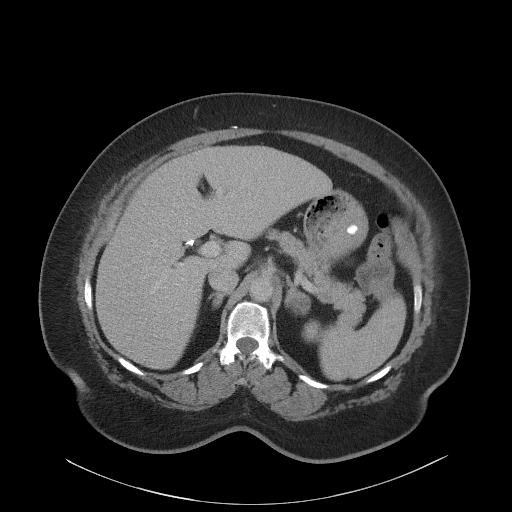
[im 75/97  soft-tissue]
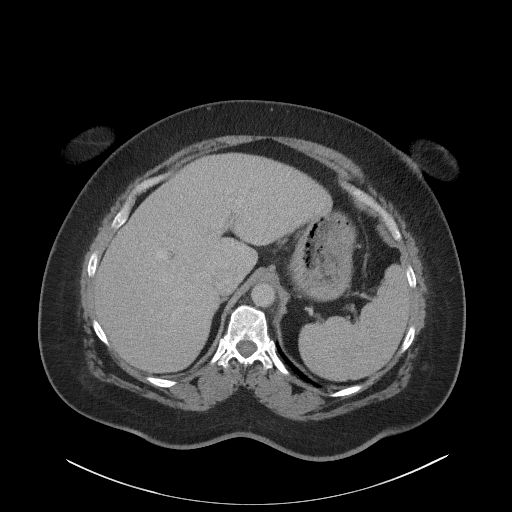
[im 86/97  soft-tissue]
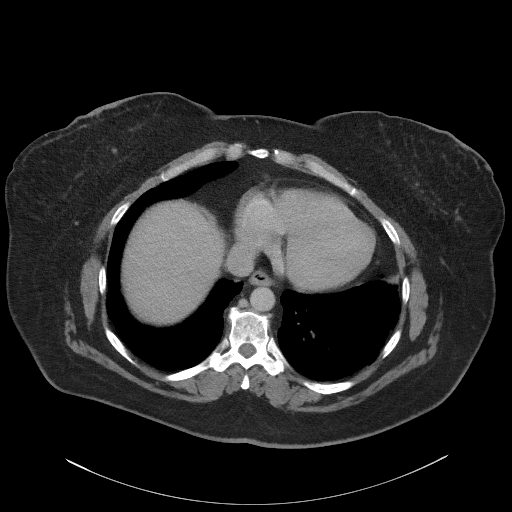
[im 91/97  soft-tissue]
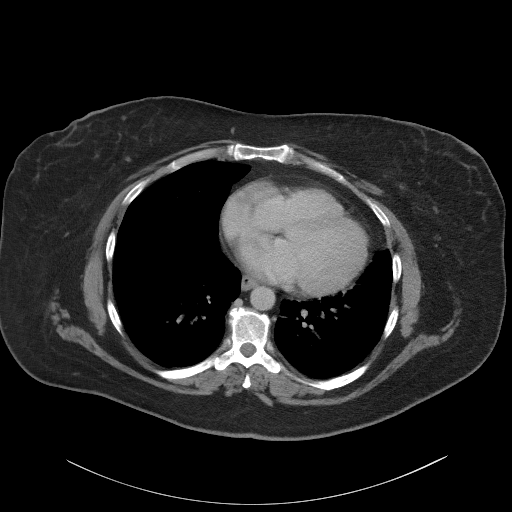

[Series 5: coronal st · coronal · 0.84mm/px · 3 of 101 slices shown]
[im 34/101  soft-tissue]
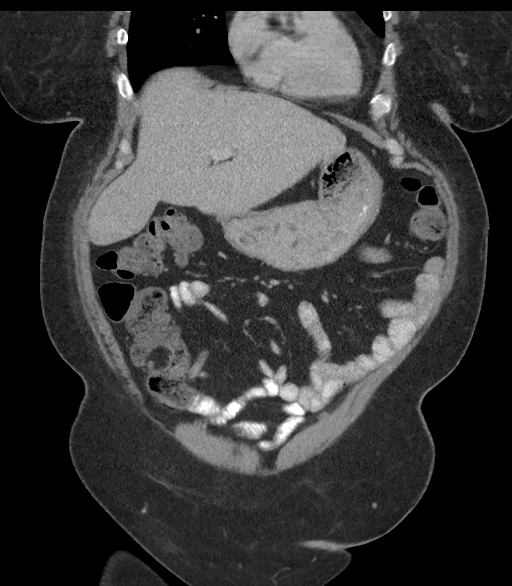
[im 45/101  soft-tissue]
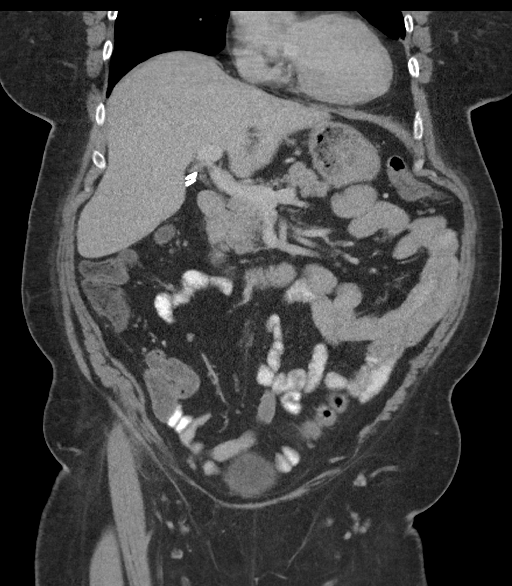
[im 56/101  soft-tissue]
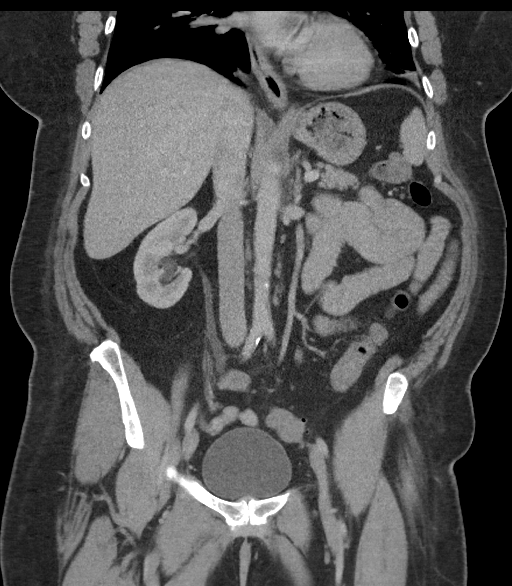

[17 of 46 positions shown; findings below may reference images not displayed]

FINDINGS: Lower chest:  No contributory findings.

Hepatobiliary: No focal liver abnormality.Cholecystectomy.

Pancreas: Unremarkable.

Spleen: Unremarkable.

Adrenals/Urinary Tract: 2 left adrenal nodules measuring up to
cm at the body and 1.5 cm at the lateral limb. Both were present in
9675, although larger today. Small left renal cystic density. No
hydronephrosis or stone. Unremarkable bladder.

Stomach/Bowel: No obstruction. Appendectomy. Rare distal colonic
diverticula.

Vascular/Lymphatic: No acute vascular abnormality. Mild
atherosclerotic plaque. No mass or adenopathy.

Reproductive:No pathologic findings.

Other: No ascites or pneumoperitoneum. Shallow right inguinal hernia
which is fatty.

Musculoskeletal: No acute abnormalities.
IMPRESSION: 1. No acute finding.
2. Left adrenal adenomas.
3. Cholecystectomy and appendectomy.
4. Mild aortic atherosclerosis.
5. Fatty right inguinal hernia.

## 2022-02-16 ENCOUNTER — Other Ambulatory Visit: Payer: Self-pay

## 2022-02-16 ENCOUNTER — Encounter (HOSPITAL_BASED_OUTPATIENT_CLINIC_OR_DEPARTMENT_OTHER): Payer: Self-pay | Admitting: Emergency Medicine

## 2022-02-16 ENCOUNTER — Emergency Department (HOSPITAL_BASED_OUTPATIENT_CLINIC_OR_DEPARTMENT_OTHER)
Admission: EM | Admit: 2022-02-16 | Discharge: 2022-02-16 | Disposition: A | Discharge status: Home / Self Care | Payer: 59 | Attending: Emergency Medicine | Admitting: Emergency Medicine

## 2022-02-16 DIAGNOSIS — Z20822 Contact with and (suspected) exposure to covid-19: Secondary | ICD-10-CM | POA: Diagnosis not present

## 2022-02-16 DIAGNOSIS — J101 Influenza due to other identified influenza virus with other respiratory manifestations: Secondary | ICD-10-CM | POA: Insufficient documentation

## 2022-02-16 DIAGNOSIS — M791 Myalgia, unspecified site: Secondary | ICD-10-CM | POA: Diagnosis present

## 2022-02-16 DIAGNOSIS — J111 Influenza due to unidentified influenza virus with other respiratory manifestations: Secondary | ICD-10-CM

## 2022-02-16 LAB — SARS CORONAVIRUS 2 BY RT PCR: SARS Coronavirus 2 by RT PCR: NEGATIVE

## 2022-02-16 MED ORDER — IBUPROFEN 400 MG PO TABS
600.0000 mg | ORAL_TABLET | Freq: Once | ORAL | Status: AC
  Administered 2022-02-16: 600 mg via ORAL
  Filled 2022-02-16: qty 1

## 2022-02-16 NOTE — ED Provider Notes (Signed)
[facility] EMERGENCY DEPARTMENT Provider Note   CSN: [id number] Arrival date & time: 02/16/22  1750     History  Chief Complaint  Patient presents with   Generalized Body Aches    Deborah Dodson is a 46 y.o. female.  HPI   46 year old female presents emergency department with complaints of fever, body aches, headache, nasal congestion, sore throat that began upon awakening this morning.  She has tried both Tylenol and aspirin at home which has helped with the fever but she still states that headache still vaguely remains.  She describes it as frontal in nature and is "foggy."  It came on gradually and has been present since its onset.  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  She denies any known sick exposure in the house.  Denies chest pain, shortness of breath, nausea, abdominal pain, vomiting, urinary/vaginal symptoms, change in bowel habits, cough.  Denies any sickness just prior to symptom onset.  Past medical history significant for rheumatoid arthritis of which she is not taking any medication, hypertension  Home Medications Prior to Admission medications   Medication Sig Start Date End Date Taking? Authorizing Provider  cetirizine (ZYRTEC) 10 MG tablet Take 10 mg by mouth daily.    [provider]  dicyclomine (BENTYL) 20 MG tablet Take 1 tablet (20 mg total) by mouth every 8 (eight) hours as needed for spasms (Abdominal cramping). 10/27/19   Ward, Layla Maw, DO  erythromycin ophthalmic ointment Place a 1/2 inch ribbon of ointment into the lower eyelid. 09/09/21   Tomi Bamberger, PA-C  ibuprofen (ADVIL,MOTRIN) 200 MG tablet Take 400 mg by mouth every 6 (six) hours as needed. Pain.    [provider]  montelukast (SINGULAIR) 10 MG tablet Take 10 mg by mouth at bedtime.    [provider]  ondansetron (ZOFRAN ODT) 4 MG disintegrating tablet Take 1 tablet (4 mg  total) by mouth every 8 (eight) hours as needed for nausea or vomiting. 04/29/21   Vanetta Mulders, MD  potassium chloride SA (KLOR-CON) 20 MEQ tablet Take 1 tablet (20 mEq total) by mouth 2 (two) times daily. 04/29/21   Vanetta Mulders, MD      Allergies    Penicillins, Sulfa antibiotics, Ciprofloxacin, and Iodine    Review of Systems   Review of Systems  All other systems reviewed and are negative.   Physical Exam Updated Vital Signs BP (!) 164/76   Pulse 100   Temp 100 F (37.8 C) (Oral)   Resp 18   Ht 5\' 5"  (1.651 m)   Wt 102.1 kg   LMP 09/05/2014   SpO2 97%   BMI 37.44 kg/m  Physical Exam Vitals and nursing note reviewed.  Constitutional:      General: She is not in acute distress.    Appearance: She is well-developed. She is obese.  HENT:     Head: Normocephalic and atraumatic.     Right Ear: Tympanic membrane normal.     Left Ear: Tympanic membrane normal.     Nose: Congestion present.     Mouth/Throat:     Mouth: Mucous membranes are moist.     Pharynx: Oropharynx is clear.     Comments: Minimal posterior pharyngeal erythema.  Uvula midline and rises symmetrically with phonation.  No obvious tonsillar exudate.  Tonsils 1+ bilaterally. Eyes:     Extraocular Movements: Extraocular movements intact.     Conjunctiva/sclera: Conjunctivae  normal.     Pupils: Pupils are equal, round, and reactive to light.  Neck:     Comments: Kernig and Brudzinski negative. Cardiovascular:     Rate and Rhythm: Normal rate and regular rhythm.     Heart sounds: No murmur heard. Pulmonary:     Effort: Pulmonary effort is normal. No respiratory distress.     Breath sounds: Normal breath sounds.  Abdominal:     Palpations: Abdomen is soft.     Tenderness: There is no abdominal tenderness. There is no right CVA tenderness or left CVA tenderness.  Musculoskeletal:        General: No swelling.     Cervical back: Neck supple. No rigidity.  Skin:    General: Skin is warm and dry.      Capillary Refill: Capillary refill takes less than 2 seconds.  Neurological:     Mental Status: She is alert.     Comments: Alert and oriented to self, place, time and event.   Speech is fluent, clear without dysarthria or dysphasia.   Strength 5/5 in upper/lower extremities   Sensation intact in upper/lower extremities   Normal gait.  Negative Romberg. No pronator drift.  Normal finger-to-nose and feet tapping.  CN I not tested  CN II grossly intact visual fields bilaterally. Did not visualize posterior eye.  CN III, IV, VI PERRLA and EOMs intact bilaterally  CN V Intact sensation to sharp and light touch to the face  CN VII facial movements symmetric  CN VIII not tested  CN IX, X no uvula deviation, symmetric rise of soft palate  CN XI 5/5 SCM and trapezius strength bilaterally  CN XII Midline tongue protrusion, symmetric L/R movements   Psychiatric:        Mood and Affect: Mood normal.     ED Results / Procedures / Treatments   Labs (all labs ordered are listed, but only abnormal results are displayed) Labs Reviewed  SARS CORONAVIRUS 2 BY RT PCR    EKG None  Radiology No results found.  Procedures Procedures    Medications Ordered in ED Medications  ibuprofen (ADVIL) tablet 600 mg (600 mg Oral Given 02/16/22 1852)    ED Course/ Medical Decision Making/ A&P                           Medical Decision Making  This patient presents to the ED for concern of flulike symptoms, this involves an extensive number of treatment options, and is a complaint that carries with it a high risk of complications and morbidity.  The differential diagnosis includes influenza, COVID, URI, meningitis, pneumonia   Co morbidities that complicate the patient evaluation  See HPI   Additional history obtained:  Additional history obtained from EMR External records from outside source obtained and reviewed including chest x-ray from 07/31/2021 indicative of no acute  cardiopulmonary process.   Lab Tests:  I Ordered, and personally interpreted labs.  The pertinent results include: Coronavirus negative.   Imaging Studies ordered:  N/a   Cardiac Monitoring: / EKG:  The patient was maintained on a cardiac monitor.  I personally viewed and interpreted the cardiac monitored which showed an underlying rhythm of: Sinus rhythm   Consultations Obtained:  N/a   Problem List / ED Course / Critical interventions / Medication management  I ordered medication including ibuprofen for headache   Reevaluation of the patient after these medicines showed that the patient improved I have  reviewed the patients home medicines and have made adjustments as needed   Social Determinants of Health:  Persistent cigarette use.  Denies alcohol or illicit drug use.   Test / Admission - Considered:  Flulike symptoms Vitals signs significant for hypertension with a blood pressure 164/76.  Recommend close follow-up with PCP regarding elevation of blood pressure. Otherwise within normal range and stable throughout visit. Laboratory studies significant for: See above Patient exhibiting flulike symptoms.  Doubt meningitis at this time given associated symptoms of generalized body aches, sore throat as well as reassuring physical exam.  I discussed with patient at length regarding worrisome signs and symptoms of meningitis and strict return precautions therein.  Symptomatic therapy recommended with Tylenol/ibuprofen as well as plenty of oral hydration.  Patient nontoxic in appearance and fever responding well to oral antiemetics.  Further laboratory and imaging studies deemed unnecessary at this time.  Treatment plan was discussed at length with patient and she knowledge understanding and was agreeable to said plan. Worrisome signs and symptoms were discussed with the patient, and the patient acknowledged understanding to return to the ED if noticed. Patient was stable upon  discharge.         Final Clinical Impression(s) / ED Diagnoses Final diagnoses:  Influenza-like illness    Rx / DC Orders ED Discharge Orders     None         Peter Garter, [place] 02/16/22 1911    Vanetta Mulders, MD 02/16/22 [phone]

## 2022-02-16 NOTE — ED Notes (Signed)
Patient states that she had some nausea took Zofran for relief. Patient states that she has a runny nose with clear drainage. Denies any coughing. States that she has generalized body aches

## 2022-02-16 NOTE — Discharge Instructions (Addendum)
Note the work-up today was overall negative for coronavirus.  That being said, there are many respiratory viruses that can cause symptoms similar to what you are experiencing today including influenza.  Treatment for all of these is the same.  Maintain adequate oral hydration.  Take Tylenol/ibuprofen as needed for pain/inflammation.  If your fever does not respond to medicines, if you or your family notice alterations in your mental status, if neck feels stiff or painful with movements or any other symptoms listed in the information packet, please do not hesitate to return to the emergency department for further work-up.  I recommend reevaluation by your primary care in 2 to 3 days to make sure symptoms are getting better and not worse.  Please do not hesitate to return to the emergency department if the worrisome signs and symptoms we discussed become apparent.

## 2022-02-16 NOTE — ED Triage Notes (Signed)
Pt woke with fever, body aches, HA today

## 2022-03-20 DIAGNOSIS — M25531 Pain in right wrist: Secondary | ICD-10-CM | POA: Insufficient documentation

## 2022-04-15 DIAGNOSIS — G5601 Carpal tunnel syndrome, right upper limb: Secondary | ICD-10-CM | POA: Insufficient documentation

## 2022-05-17 DIAGNOSIS — E119 Type 2 diabetes mellitus without complications: Secondary | ICD-10-CM

## 2022-05-17 HISTORY — DX: Type 2 diabetes mellitus without complications: E11.9

## 2023-04-06 ENCOUNTER — Ambulatory Visit
Admission: EM | Admit: 2023-04-06 | Discharge: 2023-04-06 | Disposition: A | Discharge status: Home / Self Care | Payer: 59

## 2023-04-06 DIAGNOSIS — L0291 Cutaneous abscess, unspecified: Secondary | ICD-10-CM

## 2023-04-06 MED ORDER — DOXYCYCLINE HYCLATE 100 MG PO CAPS: 100.0000 mg | ORAL_CAPSULE | Freq: Two times a day (BID) | ORAL | 0 refills | Status: DC

## 2023-04-06 NOTE — ED Triage Notes (Signed)
Patient presents to [facility] for rash to LUE and right abdominal area since Friday. States they do not itch, they are painful. Not taking any OTC meds for symptom relief.

## 2023-04-06 NOTE — ED Provider Notes (Signed)
[facility]    CSN: [id number] Arrival date & time: 04/06/23  1151      History   Chief Complaint Chief Complaint  Patient presents with   Rash    HPI Timera Windt is a 47 y.o. female.   Patient here today for evaluation of multiple lesions to her right upper leg and right abdomen that she first noticed a few days ago.  She states that they do not itch but are painful.  She has not taken any medication for treatment.  He denies any fever.  The history is provided by the patient.  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 Corliss Skains, MD;  Location: [facility];  Service: General;  Laterality: N/A;    OB History   No obstetric history on file.      Home Medications    Prior to Admission medications   Medication Sig Start Date End Date Taking? Authorizing Provider  doxycycline (VIBRAMYCIN) 100 MG capsule Take 1 capsule (100 mg total) by mouth 2 (two) times daily. 04/06/23  Yes Tomi Bamberger, PA-C  furosemide (LASIX) 20 MG tablet Take by mouth. 01/14/22  Yes [provider]  losartan (COZAAR) 25 MG tablet Take 1 tablet by mouth daily. 10/16/21  Yes [provider]  cetirizine (ZYRTEC) 10 MG tablet Take 10 mg by mouth daily.    [provider]  dicyclomine (BENTYL) 20 MG tablet Take 1 tablet (20 mg total) by mouth every 8 (eight) hours as needed for spasms (Abdominal cramping). 10/27/19   Ward, Layla Maw, DO  erythromycin ophthalmic ointment Place a 1/2 inch ribbon of ointment into the lower eyelid. 09/09/21   Tomi Bamberger, PA-C  hydroxychloroquine (PLAQUENIL) 200 MG tablet Take 200 mg by mouth daily.    [provider]  ibuprofen (ADVIL,MOTRIN) 200 MG tablet Take 400 mg by mouth every 6 (six) hours as needed. Pain.    [provider]  montelukast (SINGULAIR) 10 MG tablet Take 10 mg by mouth at bedtime.    [provider]  ondansetron (ZOFRAN ODT) 4 MG disintegrating tablet Take 1 tablet (4 mg total) by mouth every 8 (eight) hours as needed for nausea or vomiting. 04/29/21   Vanetta Mulders, MD  potassium chloride SA (KLOR-CON) 20 MEQ tablet Take 1 tablet (20 mEq total) by mouth 2 (two) times daily. 04/29/21   Vanetta Mulders, MD  rosuvastatin (CRESTOR) 10 MG tablet Take 10 mg by mouth daily.    [provider]    Family History Family History  Problem Relation Age of Onset   Cancer Mother        breast   Diabetes Father    CAD Father    Cancer Sister  breast   Cancer Paternal Grandfather        lung    Social History Social History   Tobacco Use   Smoking status: Former    Current packs/day: 0.50    Average packs/day: 0.5 packs/day for 17.0 years (8.5 ttl pk-yrs)    Types: Cigarettes   Smokeless tobacco: Never   Tobacco comments:    6 cigs per day  Vaping Use   Vaping status: Never Used  Substance Use Topics   Alcohol use: No   Drug use: No     Allergies   Codeine, Penicillins, Sulfa antibiotics, Ciprofloxacin, and Iodine   Review of Systems Review of Systems  Constitutional:  Negative for chills and fever.  Eyes:  Negative for discharge and redness.  Respiratory:  Negative for shortness of breath.   Gastrointestinal:  Negative for nausea and vomiting.   Skin:  Positive for color change. Negative for rash.     Physical Exam Triage Vital Signs ED Triage Vitals  Encounter Vitals Group     BP 04/06/23 1204 (!) 144/88     Systolic BP Percentile --      Diastolic BP Percentile --      Pulse Rate 04/06/23 1204 98     Resp 04/06/23 1204 18     Temp 04/06/23 1204 98.1 F (36.7 C)     Temp Source 04/06/23 1204 Oral     SpO2 04/06/23 1204 97 %     Weight --      Height --      Head Circumference --      Peak Flow --      Pain Score 04/06/23 1201 6     Pain Loc --      Pain Education --      Exclude from Growth Chart --    No data found.  Updated Vital Signs BP (!) 144/88 (BP Location: Left Arm)   Pulse 98   Temp 98.1 F (36.7 C) (Oral)   Resp 18   LMP 09/05/2014   SpO2 97%       Physical Exam Vitals and nursing note reviewed.  Constitutional:      General: She is not in acute distress.    Appearance: Normal appearance. She is not ill-appearing.  HENT:     Head: Normocephalic and atraumatic.  Eyes:     Conjunctiva/sclera: Conjunctivae normal.  Cardiovascular:     Rate and Rhythm: Normal rate.  Pulmonary:     Effort: Pulmonary effort is normal. No respiratory distress.  Skin:    Comments: 3 scattered pustular lesions with surrounding erythema noted to right abdomen with single similar-appearing lesion to right anterior upper leg  Neurological:     Mental Status: She is alert.  Psychiatric:        Mood and Affect: Mood normal.        Behavior: Behavior normal.        Thought Content: Thought content normal.      UC Treatments / Results  Labs (all labs ordered are listed, but only abnormal results are displayed) Labs Reviewed - No data to display  EKG   Radiology No results found.  Procedures Procedures (including critical care time)  Medications Ordered in UC Medications - No data to display  Initial Impression / Assessment and Plan / UC Course  I have reviewed the triage vital signs and the  nursing notes.  Pertinent labs & imaging results that were available during my care of the patient  were reviewed by me and considered in my medical decision making (see chart for details).    Doxycycline prescribed for suspected abscesses.  Recommended follow-up if no gradual improvement with any worsening.  Patient expressed understanding.  Final Clinical Impressions(s) / UC Diagnoses   Final diagnoses:  Abscess   Discharge Instructions   None    ED Prescriptions     Medication Sig Dispense Auth. Provider   doxycycline (VIBRAMYCIN) 100 MG capsule Take 1 capsule (100 mg total) by mouth 2 (two) times daily. 20 capsule Tomi Bamberger, PA-C      PDMP not reviewed this encounter.   Tomi Bamberger, PA-C 04/06/23 1349

## 2023-08-20 ENCOUNTER — Emergency Department (HOSPITAL_BASED_OUTPATIENT_CLINIC_OR_DEPARTMENT_OTHER)
Admission: EM | Admit: 2023-08-20 | Discharge: 2023-08-20 | Disposition: A | Discharge status: Home / Self Care | Payer: BC Managed Care – PPO | Attending: Emergency Medicine | Admitting: Emergency Medicine

## 2023-08-20 ENCOUNTER — Other Ambulatory Visit: Payer: Self-pay

## 2023-08-20 ENCOUNTER — Encounter (HOSPITAL_BASED_OUTPATIENT_CLINIC_OR_DEPARTMENT_OTHER): Payer: Self-pay | Admitting: Emergency Medicine

## 2023-08-20 DIAGNOSIS — B379 Candidiasis, unspecified: Secondary | ICD-10-CM | POA: Diagnosis not present

## 2023-08-20 DIAGNOSIS — R21 Rash and other nonspecific skin eruption: Secondary | ICD-10-CM

## 2023-08-20 DIAGNOSIS — E119 Type 2 diabetes mellitus without complications: Secondary | ICD-10-CM | POA: Diagnosis not present

## 2023-08-20 DIAGNOSIS — L089 Local infection of the skin and subcutaneous tissue, unspecified: Secondary | ICD-10-CM | POA: Diagnosis present

## 2023-08-20 MED ORDER — FLUCONAZOLE 200 MG PO TABS: 200.0000 mg | ORAL_TABLET | Freq: Every day | ORAL | 0 refills | Status: AC

## 2023-08-20 MED ORDER — DOXYCYCLINE HYCLATE 100 MG PO CAPS: 100.0000 mg | ORAL_CAPSULE | Freq: Two times a day (BID) | ORAL | 0 refills | Status: AC

## 2023-08-20 NOTE — ED Triage Notes (Signed)
 Pt dealing with "breakouts" since beginning of Feb (was tx with steroids and abx); sxs have returned; has multiple red, inflamed bumps, some large, to BUE and trunk; also has bright red solid rash under breasts

## 2023-08-20 NOTE — Discharge Instructions (Signed)
 Pleas take the medication as prescribed. Follow with your PCP in the coming week. Return with any new or suddenly worsening symptoms.

## 2023-09-01 NOTE — ED Provider Notes (Signed)
 Emergency Department Provider Note   I have reviewed the triage vital signs and the nursing notes.   HISTORY  Chief Complaint Skin Infection    HPI Deborah Dodson is a 48 y.o. female past history of diabetes presents the emergency department with continued breakouts of the skin since February.  She has multiple inflamed bumps over her arms and trunk as well as a more solid rash under both breasts.  No fevers.  No new medications.  No sores or pain in the mouth.  In the past she took a combination of antibiotics and steroids with improvement in symptoms.   Past Medical History:  Diagnosis Date   Arthritis    No pertinent past medical history    Type 2 diabetes mellitus (HCC) 05/17/2022    Review of Systems  Constitutional: No fever/chills Cardiovascular: Denies chest pain. Respiratory: Denies shortness of breath. Gastrointestinal: No abdominal pain. Musculoskeletal: Negative for back pain. Skin: Positive for rash.  Neurological: Negative for headaches.   ____________________________________________   PHYSICAL EXAM:  VITAL SIGNS: ED Triage Vitals  Encounter Vitals Group     BP 08/20/23 1834 (!) 162/95     Pulse Rate 08/20/23 1834 89     Resp 08/20/23 1834 18     Temp 08/20/23 1834 97.7 F (36.5 C)     Temp src --      SpO2 08/20/23 1834 96 %     Weight 08/20/23 1833 237 lb (107.5 kg)     Height 08/20/23 1833 5' 5.5" (1.664 m)   Constitutional: Alert and oriented. Well appearing and in no acute distress. Eyes: Conjunctivae are normal.  Head: Atraumatic. Nose: No congestion/rhinnorhea. Mouth/Throat: Mucous membranes are moist.   Cardiovascular: Normal rate, regular rhythm. Good peripheral circulation.   Respiratory: Normal respiratory effort.   Gastrointestinal: No distention.  Musculoskeletal: No gross deformities of extremities. Neurologic:  Normal speech and language. Skin:  Skin is warm and dry.  Fine, palpable rash over the extremities and upper  chest.  Slight crusting component.  No petechiae.  No bullae.  Diffuse, erythematous, slightly white-tinged discharge under both breasts most consistent with Candida. No induration.   ____________________________________________   PROCEDURES  Procedure(s) performed:   Procedures  None  ____________________________________________   INITIAL IMPRESSION / ASSESSMENT AND PLAN / ED COURSE  Pertinent labs & imaging results that were available during my care of the patient were reviewed by me and considered in my medical decision making (see chart for details).   This patient is Presenting for Evaluation of rash, which does require a range of treatment options, and is a complaint that involves a moderate risk of morbidity and mortality.  The Differential Diagnoses include Candida skin infection, cellulitis, SJS/TEN, etc.  Medical Decision Making: Summary:  Patient presents emergency department with rash.  Seems most consistent with Candida type rash under the breasts.  Plan to start both Diflucan and doxycycline covering for the more fine, slightly pustular rash in the upper extremities which could be bacterial in origin.  Discussed need for close PCP follow-up and referral to dermatology if symptoms continue to recur.  Patient's presentation is most consistent with acute, uncomplicated illness.   Disposition: discharge  ____________________________________________  FINAL CLINICAL IMPRESSION(S) / ED DIAGNOSES  Final diagnoses:  Candida infection  Rash     NEW OUTPATIENT MEDICATIONS STARTED DURING THIS VISIT:  Discharge Medication List as of 08/20/2023  8:17 PM     START taking these medications   Details  fluconazole (DIFLUCAN) 200 MG  tablet Take 1 tablet (200 mg total) by mouth daily for 7 days., Starting Wed 08/20/2023, Until Wed 08/27/2023, Normal        Note:  This document was prepared using Dragon voice recognition software and may include unintentional dictation  errors.  Alona Bene, MD, [facility]    Daruis Swaim, Arlyss Repress, MD 09/01/23 [phone]

## 2023-09-21 ENCOUNTER — Ambulatory Visit
Admission: RE | Admit: 2023-09-21 | Discharge: 2023-09-21 | Disposition: A | Discharge status: Home / Self Care | Source: Ambulatory Visit | Attending: Internal Medicine | Admitting: Internal Medicine

## 2023-09-21 ENCOUNTER — Telehealth: Payer: Self-pay | Admitting: Emergency Medicine

## 2023-09-21 VITALS — BP 168/96 | HR 85 | Temp 98.3°F | Resp 18

## 2023-09-21 DIAGNOSIS — J0110 Acute frontal sinusitis, unspecified: Secondary | ICD-10-CM | POA: Diagnosis not present

## 2023-09-21 MED ORDER — CEFDINIR 300 MG PO CAPS: 300.0000 mg | ORAL_CAPSULE | Freq: Two times a day (BID) | ORAL | 0 refills | Status: DC

## 2023-09-21 MED ORDER — CEFDINIR 300 MG PO CAPS: 300.0000 mg | ORAL_CAPSULE | Freq: Two times a day (BID) | ORAL | 0 refills | Status: AC

## 2023-09-21 NOTE — ED Provider Notes (Signed)
 [facility]    CSN: [id number] Arrival date & time: 09/21/23  1224      History   Chief Complaint Chief Complaint  Patient presents with   Nasal Congestion    Sinus pressure m, headaches. - Entered by patient    HPI Deborah Dodson is a 48 y.o. female.   Deborah Dodson is a 48 y.o. female presenting for chief complaint of nasal congestion, headache, and facial pain that started 1 week ago. Mucous is green and thick. Minimal cough. Denies ear pain, fever, chills, nausea, vomiting, diarrhea, and rash. No vision changes. History of seasonal allergies, takes zyrtec and Flonase. Denies recent antibiotic/steroid use. Taking OTC medications without much relief.      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 Corliss Skains, MD;  Location: [facility];  Service: General;  Laterality: N/A;    OB History   No obstetric history on file.      Home Medications    Prior to Admission medications   Medication Sig Start Date End Date Taking? Authorizing Provider  cefdinir (OMNICEF) 300 MG capsule Take 1 capsule (300 mg total) by mouth 2 (two) times daily for 7 days. 09/21/23 09/28/23  Carlisle Beers, FNP  cetirizine (ZYRTEC) 10 MG tablet Take 10 mg by mouth daily.    [provider]  dicyclomine (BENTYL) 20 MG tablet Take 1 tablet (20 mg total) by mouth every 8  (eight) hours as needed for spasms (Abdominal cramping). Patient not taking: Reported on 09/21/2023 10/27/19   Ward, Layla Maw, DO  erythromycin ophthalmic ointment Place a 1/2 inch ribbon of ointment into the lower eyelid. 09/09/21   Tomi Bamberger, PA-C  furosemide (LASIX) 20 MG tablet Take by mouth. 01/14/22   [provider]  hydroxychloroquine (PLAQUENIL) 200 MG tablet Take 200 mg by mouth daily. Patient not taking: Reported on 09/21/2023    [provider]  ibuprofen (ADVIL,MOTRIN) 200 MG tablet Take 400 mg by mouth every 6 (six) hours as needed. Pain.    [provider]  losartan (COZAAR) 25 MG tablet Take 1 tablet by mouth daily. Patient not taking: Reported on 09/21/2023 10/16/21   [provider]  montelukast (SINGULAIR) 10 MG tablet Take 10 mg by mouth at bedtime. Patient not taking: Reported on 09/21/2023    [provider]  ondansetron (ZOFRAN ODT) 4 MG disintegrating tablet Take 1 tablet (4 mg total) by mouth every 8 (eight) hours as needed for nausea or vomiting. 04/29/21   Vanetta Mulders, MD  potassium chloride SA (KLOR-CON) 20 MEQ tablet Take 1 tablet (20 mEq total) by mouth 2 (two) times daily. 04/29/21   Vanetta Mulders, MD  rosuvastatin (CRESTOR) 10 MG tablet Take 10 mg by mouth daily. Patient not taking: Reported on 09/21/2023    [provider]    Family History Family  History  Problem Relation Age of Onset   Cancer Mother        breast   Diabetes Father    CAD Father    Cancer Sister        breast   Cancer Paternal Grandfather        lung    Social History Social History   Tobacco Use   Smoking status: Former    Current packs/day: 0.50    Average packs/day: 0.5 packs/day for 17.0 years (8.5 ttl pk-yrs)    Types: Cigarettes   Smokeless tobacco: Never   Tobacco comments:    6 cigs per day  Vaping Use   Vaping status: Never Used  Substance Use Topics   Alcohol use: No   Drug use: No     Allergies    Codeine, Penicillins, Sulfa antibiotics, Ciprofloxacin, and Iodine   Review of Systems Review of Systems Per HPI  Physical Exam Triage Vital Signs ED Triage Vitals  Encounter Vitals Group     BP 09/21/23 1231 (!) 168/96     Systolic BP Percentile --      Diastolic BP Percentile --      Pulse Rate 09/21/23 1231 85     Resp 09/21/23 1231 18     Temp 09/21/23 1231 98.3 F (36.8 C)     Temp Source 09/21/23 1231 Oral     SpO2 09/21/23 1231 96 %     Weight --      Height --      Head Circumference --      Peak Flow --      Pain Score 09/21/23 1234 6     Pain Loc --      Pain Education --      Exclude from Growth Chart --    No data found.  Updated Vital Signs BP (!) 168/96 (BP Location: Right Arm)   Pulse 85   Temp 98.3 F (36.8 C) (Oral)   Resp 18   LMP 09/05/2014   SpO2 96%   Visual Acuity Right Eye Distance:   Left Eye Distance:   Bilateral Distance:    Right Eye Near:   Left Eye Near:    Bilateral Near:     Physical Exam Vitals and nursing note reviewed.  Constitutional:      Appearance: She is not ill-appearing or toxic-appearing.  HENT:     Head: Normocephalic and atraumatic.     Right Ear: Hearing, tympanic membrane, ear canal and external ear normal.     Left Ear: Hearing, tympanic membrane, ear canal and external ear normal.     Nose: Congestion present.     Right Sinus: Frontal sinus tenderness present.     Left Sinus: Frontal sinus tenderness present.     Mouth/Throat:     Lips: Pink.     Mouth: Mucous membranes are moist. No injury or oral lesions.     Dentition: Normal dentition.     Tongue: No lesions.     Pharynx: Oropharynx is clear. Uvula midline. No pharyngeal swelling, oropharyngeal exudate, posterior oropharyngeal erythema, uvula swelling or postnasal drip.     Tonsils: No tonsillar exudate.  Eyes:     General: Lids are normal. Vision grossly intact. Gaze aligned appropriately.     Extraocular Movements: Extraocular movements  intact.     Conjunctiva/sclera: Conjunctivae normal.  Neck:     Trachea: Trachea and phonation normal.  Cardiovascular:     Rate and Rhythm: Normal rate and regular  rhythm.     Heart sounds: Normal heart sounds, S1 normal and S2 normal.  Pulmonary:     Effort: Pulmonary effort is normal. No respiratory distress.     Breath sounds: Normal breath sounds and air entry.  Musculoskeletal:     Cervical back: Neck supple.  Lymphadenopathy:     Cervical: No cervical adenopathy.  Skin:    General: Skin is warm and dry.     Capillary Refill: Capillary refill takes less than 2 seconds.     Findings: No rash.  Neurological:     General: No focal deficit present.     Mental Status: She is alert and oriented to person, place, and time. Mental status is at baseline.     Cranial Nerves: No dysarthria or facial asymmetry.  Psychiatric:        Mood and Affect: Mood normal.        Speech: Speech normal.        Behavior: Behavior normal.        Thought Content: Thought content normal.        Judgment: Judgment normal.      UC Treatments / Results  Labs (all labs ordered are listed, but only abnormal results are displayed) Labs Reviewed - No data to display  EKG   Radiology No results found.  Procedures Procedures (including critical care time)  Medications Ordered in UC Medications - No data to display  Initial Impression / Assessment and Plan / UC Course  I have reviewed the triage vital signs and the nursing notes.  Pertinent labs & imaging results that were available during my care of the patient were reviewed by me and considered in my medical decision making (see chart for details).   1. Acute non-recurrent frontal sinusitis Presentation is consistent with acute postviral bacterial sinusitis.   Symptoms have been present for greater than 7 days and have not responded well to over-the-counter therapies, therefore may have antibiotic.   Penicillin allergy is arms/feet  swelling, no anaphylaxis. She has tolerated cephalosporin in the past and is agreeable to using this for treatment today.  Cefdinir ordered.  Prescriptions for further symptomatic relief sent, may continue using OTC medications as needed. Deferred imaging of the chest based on stable cardiopulmonary exam and hemodynamically stable vital signs. Recommend warm compresses to the sinuses as needed.   Counseled patient on potential for adverse effects with medications prescribed/recommended today, strict ER and return-to-clinic precautions discussed, patient verbalized understanding.    Final Clinical Impressions(s) / UC Diagnoses   Final diagnoses:  Acute non-recurrent frontal sinusitis     Discharge Instructions      Your evaluation shows you have a bacterial sinus infection. - Take antibiotic sent to pharmacy as directed to treat sinus infection. - Purchase Mucinex over the counter and take this every 12 hours as needed for nasal congestion and cough. - Cough medicines as needed. - Warm compresses to the cheeks and forehead as needed to help with sinus headaches as well as tylenol as needed.  If you develop any new or worsening symptoms or if your symptoms do not start to improve, please return here or follow-up with your primary care provider. If your symptoms are severe, please go to the emergency room.     ED Prescriptions     Medication Sig Dispense Auth. Provider   cefdinir (OMNICEF) 300 MG capsule  (Status: Discontinued) Take 1 capsule (300 mg total) by mouth 2 (two) times daily for 7 days. 14 capsule  Carlisle Beers, FNP   cefdinir (OMNICEF) 300 MG capsule Take 1 capsule (300 mg total) by mouth 2 (two) times daily for 7 days. 14 capsule Carlisle Beers, FNP      PDMP not reviewed this encounter.   Carlisle Beers, [place] 09/21/23 1310

## 2023-09-21 NOTE — Discharge Instructions (Signed)
Your evaluation shows you have a bacterial sinus infection. - Take antibiotic sent to pharmacy as directed to treat sinus infection. - Purchase Mucinex over the counter and take this every 12 hours as needed for nasal congestion and cough. - Cough medicines as needed. - Warm compresses to the cheeks and forehead as needed to help with sinus headaches as well as tylenol as needed.  If you develop any new or worsening symptoms or if your symptoms do not start to improve, please return here or follow-up with your primary care provider. If your symptoms are severe, please go to the emergency room.

## 2023-09-21 NOTE — ED Triage Notes (Signed)
 Pt c/o sinus pressure, headache, nose bleeds, and congestion for 1 week.

## 2023-10-16 ENCOUNTER — Other Ambulatory Visit: Payer: Self-pay

## 2023-10-16 ENCOUNTER — Ambulatory Visit
Admission: RE | Admit: 2023-10-16 | Discharge: 2023-10-16 | Disposition: A | Discharge status: Home / Self Care | Source: Ambulatory Visit | Attending: Physician Assistant | Admitting: Physician Assistant

## 2023-10-16 VITALS — BP 161/89 | HR 95 | Temp 98.2°F | Resp 20 | Ht 65.0 in | Wt 235.0 lb

## 2023-10-16 DIAGNOSIS — L989 Disorder of the skin and subcutaneous tissue, unspecified: Secondary | ICD-10-CM | POA: Diagnosis not present

## 2023-10-16 MED ORDER — MUPIROCIN 2 % EX OINT: 1.0000 | TOPICAL_OINTMENT | Freq: Two times a day (BID) | CUTANEOUS | 0 refills | Status: DC

## 2023-10-16 MED ORDER — DOXYCYCLINE HYCLATE 100 MG PO TABS: 100.0000 mg | ORAL_TABLET | Freq: Two times a day (BID) | ORAL | 0 refills | Status: AC

## 2023-10-16 NOTE — ED Triage Notes (Addendum)
 Pt presents with complaints of multiple abscess to left forearm/hand/upper thigh. Pt states the abscesses began to fill with pus last night 4/23. Pt currently rates her overall pain a 9/10. Pt was seen previously in ED for same symptoms on 2/26.

## 2023-10-16 NOTE — Discharge Instructions (Addendum)
 You were seen today for concerns of lesions on your left hand and forearm.  At this time I am not sure what is causing these but they do appear to be mildly infected. I have sent in a prescription for an antibiotic called doxycycline  for you to take twice per day for 7 days.  Please make sure that you finish the entire course of the antibiotic unless you develop an allergic reaction or if a provider tells you to stop. Please make sure that you keep your upcoming appointments for follow-up with regards to your skin concerns for more definitive management. I have also sent in a medication called mupirocin  for you to apply to the lesions when they first show up with the goal that this will prevent further progression. If at any point you start to develop swelling, more intense pain, drainage that looks like pus, skin tightness, red streaks please return to urgent care or go to the emergency room for further evaluation management.

## 2023-10-16 NOTE — ED Provider Notes (Signed)
 [facility]    CSN: [id number] Arrival date & time: 10/16/23  1459      History   Chief Complaint Chief Complaint  Patient presents with   Abscess    Entered by patient    HPI Deborah Dodson is a 48 y.o. female.   HPI  She reports she is having recurrent skin lesions that are painful  She reports this has been occurring intermittently since Nov 2024 and current lesions have been present for about a week but started to worsen in the last day She denies drainage, bleeding, itching She reports the areas are painful   She denies new lotions, creams, perfumes, detergents and states she only uses sensitive skin formulations since Nov She has been applying topical OTC antibacterial ointments to the area which seems to help with pain but she is worried about increasing severity of lesions on her left hand and forearm  She has seen her PCP and Derm about this but has not received conclusive dx She has an apt with Rheumatology coming up for further evaluation    Past Medical History:  Diagnosis Date   Arthritis    No pertinent past medical history    Type 2 diabetes mellitus (HCC) 05/17/2022    Patient Active Problem List   Diagnosis Date Noted   Type 2 diabetes mellitus (HCC) 05/17/2022   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 Eli Grizzle, MD;  Location: [facility];  Service: General;  Laterality: N/A;    OB History   No obstetric history on file.      Home Medications    Prior to Admission medications   Medication Sig Start Date End Date Taking? Authorizing Provider  doxycycline   (VIBRA -TABS) 100 MG tablet Take 1 tablet (100 mg total) by mouth 2 (two) times daily for 7 days. 10/16/23 10/23/23 Yes Deanza Upperman E, PA-C  mupirocin  ointment (BACTROBAN ) 2 % Apply 1 Application topically 2 (two) times daily. 10/16/23  Yes Earlisha Sharples E, PA-C  cetirizine (ZYRTEC) 10 MG tablet Take 10 mg by mouth daily.    [provider]  dicyclomine  (BENTYL ) 20 MG tablet Take 1 tablet (20 mg total) by mouth every 8 (eight) hours as needed for spasms (Abdominal cramping). Patient not taking: Reported on 09/21/2023 10/27/19   Ward, Clover Dao, DO  erythromycin  ophthalmic ointment Place a 1/2 inch ribbon of ointment into the lower eyelid. 09/09/21   Vernestine Gondola, PA-C  furosemide (LASIX) 20 MG tablet Take by mouth. 01/14/22   [provider]  hydroxychloroquine (PLAQUENIL) 200 MG tablet Take 200 mg by mouth daily. Patient not taking: Reported on 09/21/2023    [provider]  ibuprofen  (ADVIL ,MOTRIN ) 200 MG tablet Take 400 mg by mouth every 6 (six) hours as needed. Pain.    [provider]  losartan (COZAAR) 25 MG tablet Take 1 tablet by mouth daily. Patient not taking: Reported on 09/21/2023 10/16/21   [provider]  montelukast (SINGULAIR) 10 MG tablet Take 10 mg by mouth at bedtime. Patient not taking: Reported on 09/21/2023    [provider]  ondansetron  (ZOFRAN  ODT) 4  MG disintegrating tablet Take 1 tablet (4 mg total) by mouth every 8 (eight) hours as needed for nausea or vomiting. 04/29/21   Zackowski, Scott, MD  potassium chloride  SA (KLOR-CON ) 20 MEQ tablet Take 1 tablet (20 mEq total) by mouth 2 (two) times daily. 04/29/21   Zackowski, Scott, MD  rosuvastatin (CRESTOR) 10 MG tablet Take 10 mg by mouth daily. Patient not taking: Reported on 09/21/2023    [provider]    Family History Family History  Problem Relation Age of Onset   Cancer Mother        breast   Diabetes Father    CAD Father    Cancer Sister        breast    Cancer Paternal Grandfather        lung    Social History Social History   Tobacco Use   Smoking status: Former    Current packs/day: 0.50    Average packs/day: 0.5 packs/day for 17.0 years (8.5 ttl pk-yrs)    Types: Cigarettes   Smokeless tobacco: Never   Tobacco comments:    6 cigs per day  Vaping Use   Vaping status: Never Used  Substance Use Topics   Alcohol use: No   Drug use: No     Allergies   Codeine, Penicillins, Sulfa antibiotics, Ciprofloxacin , and Iodine   Review of Systems Review of Systems  Constitutional:  Positive for chills. Negative for fever.  Respiratory:  Negative for cough, shortness of breath and wheezing.   Skin:        Skin lesions      Physical Exam Triage Vital Signs ED Triage Vitals  Encounter Vitals Group     BP 10/16/23 1521 (!) 161/89     Systolic BP Percentile --      Diastolic BP Percentile --      Pulse Rate 10/16/23 1521 95     Resp 10/16/23 1521 20     Temp 10/16/23 1521 98.2 F (36.8 C)     Temp Source 10/16/23 1521 Oral     SpO2 10/16/23 1521 95 %     Weight 10/16/23 1522 235 lb (106.6 kg)     Height 10/16/23 1522 5\' 5"  (1.651 m)     Head Circumference --      Peak Flow --      Pain Score 10/16/23 1521 9     Pain Loc --      Pain Education --      Exclude from Growth Chart --    No data found.  Updated Vital Signs BP (!) 161/89 (BP Location: Right Arm)   Pulse 95   Temp 98.2 F (36.8 C) (Oral)   Resp 20   Ht 5\' 5"  (1.651 m)   Wt 235 lb (106.6 kg)   LMP 09/05/2014   SpO2 95%   BMI 39.11 kg/m   Visual Acuity Right Eye Distance:   Left Eye Distance:   Bilateral Distance:    Right Eye Near:   Left Eye Near:    Bilateral Near:     Physical Exam Vitals reviewed.  Constitutional:      General: She is awake.     Appearance: Normal appearance. She is well-developed and well-groomed.  HENT:     Head: Normocephalic and atraumatic.  Pulmonary:     Effort: Pulmonary effort is normal.  Skin:     General: Skin is warm and dry.     Findings: Erythema, lesion and rash present. Rash  is pustular.  Neurological:     Mental Status: She is alert.  Psychiatric:        Behavior: Behavior is cooperative.    Media Information   Document Information  Photos  Dorsum of left hand  10/16/2023 15:54  Attached To:  Hospital Encounter on 10/16/23 with [facility] PROVIDER  Source Information  Alexandro Line, Pearla Bottom, PA-C  [facility]  Document History      Media Information   Document Information  Photos  Left wrist/ forearm  10/16/2023 15:55  Attached To:  Hospital Encounter on 10/16/23 with [facility] PROVIDER  Source Information  Andilynn Delavega, Pearla Bottom, PA-C  [facility]  Document History      UC Treatments / Results  Labs (all labs ordered are listed, but only abnormal results are displayed) Labs Reviewed - No data to display  EKG   Radiology No results found.  Procedures Procedures (including critical care time)  Medications Ordered in UC Medications - No data to display  Initial Impression / Assessment and Plan / UC Course  I have reviewed the triage vital signs and the nursing notes.  Pertinent labs & imaging results that were available during my care of the patient were reviewed by me and considered in my medical decision making (see chart for details).      Final Clinical Impressions(s) / UC Diagnoses   Final diagnoses:  Skin lesion of left arm  Skin lesion of hand   Patient presents today with concerns for recurrent skin lesions of her hand and forearm.  She reports that she has had these before and they typically respond well to antibiotics.  She is not sure what causes them and denies recent changes to foods, medications, topicals.  She reports that the areas are painful but do not appear to drain.  Physical exam demonstrates pustular lesions along the left hand and forearm with other smaller lesions noted along lower extremities.  She does have  appointment coming up with rheumatology as she has seen dermatology for this without notable diagnosis.  Will send in doxycycline  100 mg p.o. twice daily x 7 days as well as mupirocin  ointment.  ED and return precautions reviewed and provided after visit summary.  Follow-up as needed    Discharge Instructions      You were seen today for concerns of lesions on your left hand and forearm.  At this time I am not sure what is causing these but they do appear to be mildly infected. I have sent in a prescription for an antibiotic called doxycycline  for you to take twice per day for 7 days.  Please make sure that you finish the entire course of the antibiotic unless you develop an allergic reaction or if a provider tells you to stop. Please make sure that you keep your upcoming appointments for follow-up with regards to your skin concerns for more definitive management. I have also sent in a medication called mupirocin  for you to apply to the lesions when they first show up with the goal that this will prevent further progression. If at any point you start to develop swelling, more intense pain, drainage that looks like pus, skin tightness, red streaks please return to urgent care or go to the emergency room for further evaluation management.     ED Prescriptions     Medication Sig Dispense Auth. Provider   doxycycline  (VIBRA -TABS) 100 MG tablet Take 1 tablet (100 mg total) by mouth 2 (two) times daily for 7  days. 14 tablet Lanora Reveron E, PA-C   mupirocin  ointment (BACTROBAN ) 2 % Apply 1 Application topically 2 (two) times daily. 22 g Amadi Yoshino E, PA-C      PDMP not reviewed this encounter.   Jerona Mooring, PA-C 10/20/23 1236

## 2023-11-02 IMAGING — CR DG CHEST 2V
2 series · 2 of 2 positions shown · non-contrast
Comparison: None.

CLINICAL DATA: Chest pain

EXAM:
CHEST - 2 VIEW

[chest pa]
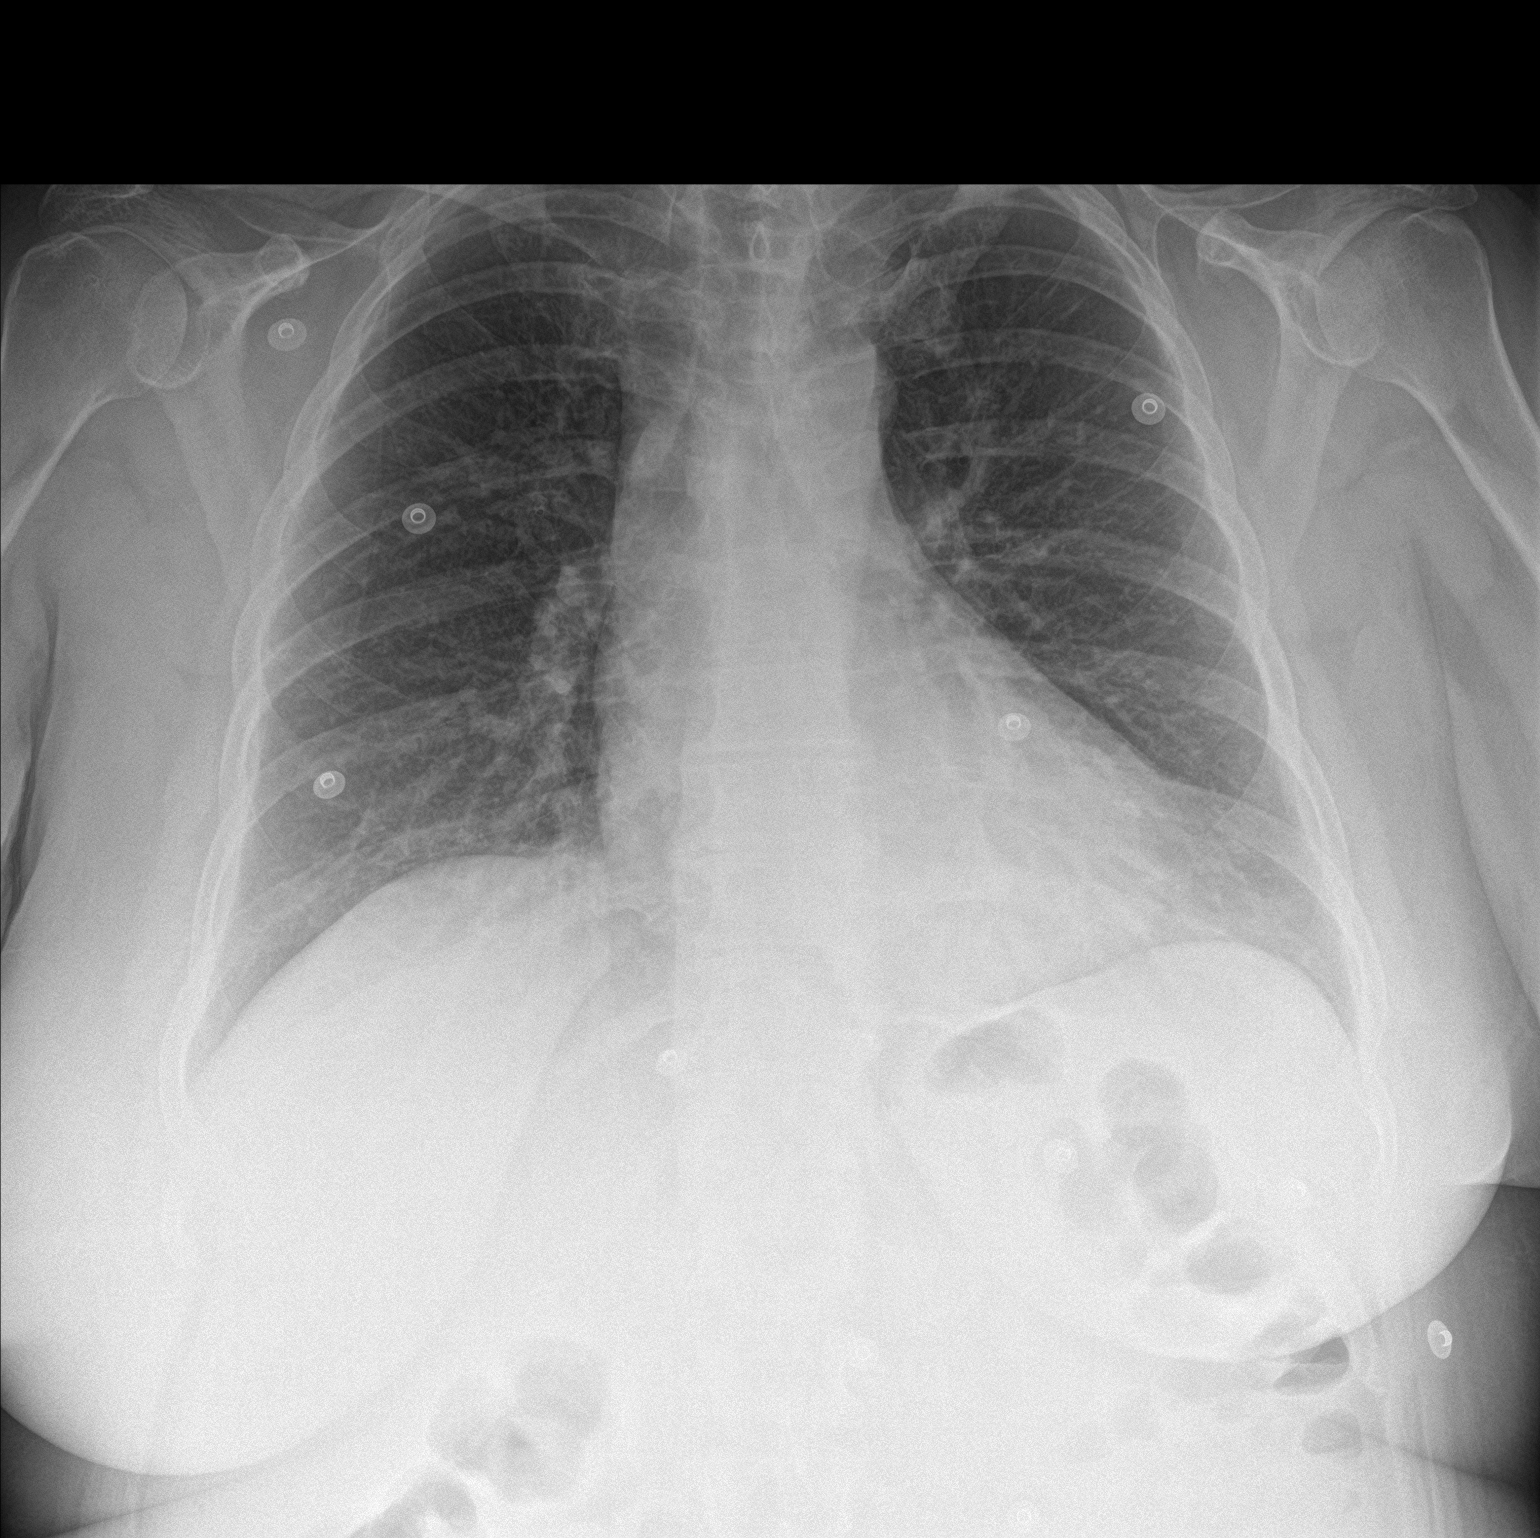

[chest lat]
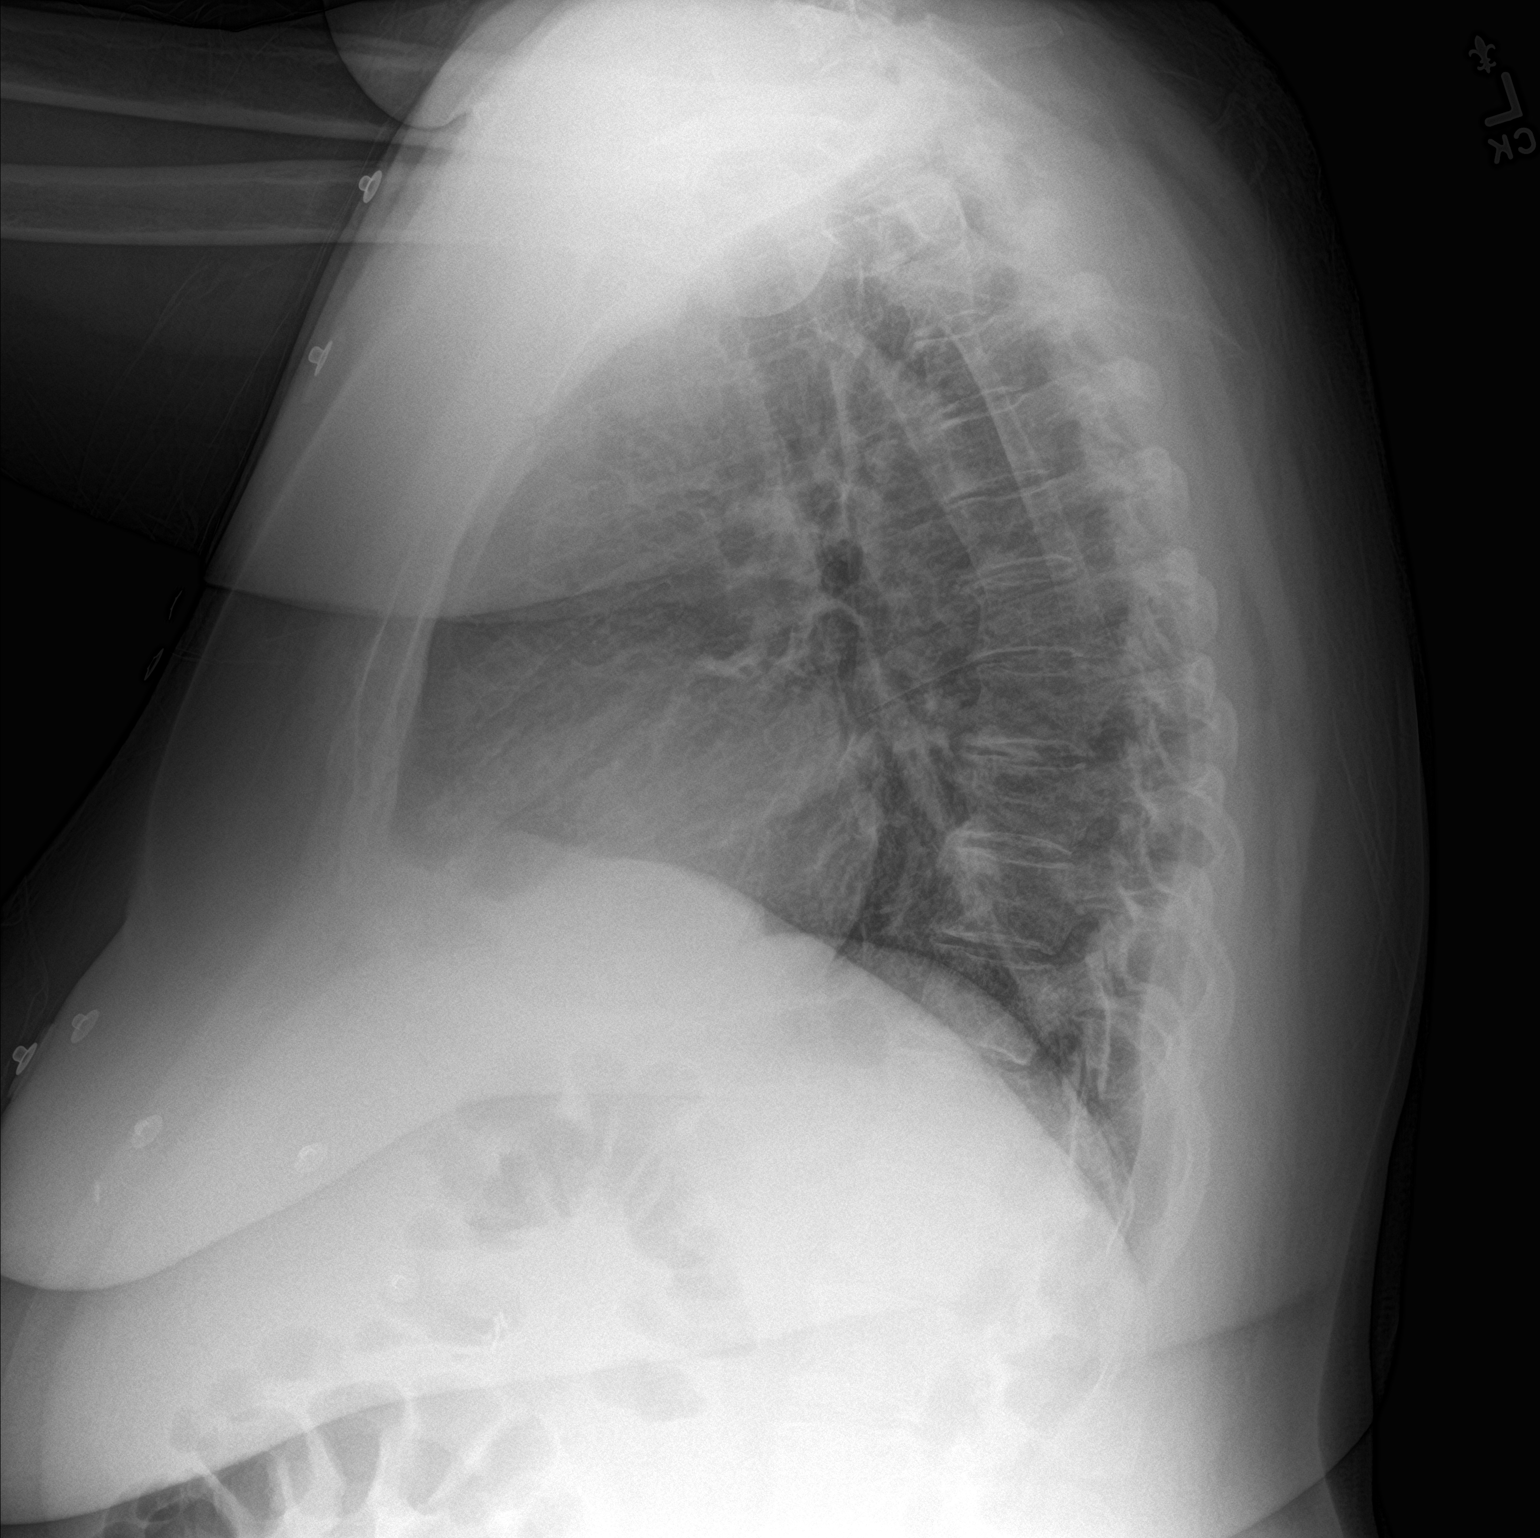

[2 of 2 positions shown; findings below may reference images not displayed]

FINDINGS: The heart size and mediastinal contours are within normal limits.
Both lungs are clear. The visualized skeletal structures are
unremarkable.
IMPRESSION: No active cardiopulmonary disease.

## 2024-04-19 ENCOUNTER — Other Ambulatory Visit: Payer: Self-pay

## 2024-04-19 ENCOUNTER — Ambulatory Visit
Admission: RE | Admit: 2024-04-19 | Discharge: 2024-04-19 | Disposition: A | Discharge status: Home / Self Care | Attending: Emergency Medicine | Admitting: Emergency Medicine

## 2024-04-19 VITALS — BP 146/84 | HR 84 | Temp 98.1°F | Resp 18 | Ht 65.0 in | Wt 234.0 lb

## 2024-04-19 DIAGNOSIS — L0231 Cutaneous abscess of buttock: Secondary | ICD-10-CM

## 2024-04-19 MED ORDER — DOXYCYCLINE HYCLATE 100 MG PO CAPS: 100.0000 mg | ORAL_CAPSULE | Freq: Two times a day (BID) | ORAL | 0 refills | Status: AC

## 2024-04-19 NOTE — ED Triage Notes (Signed)
 Pt presents with a chief complaint of skin problem on right buttock area that she noticed about five days ago. Concerned as she cannot see the area very good. Appears to be very red and tender-feeling according to pt description. Currently rates overall pain a 3/10. No medications taken PTA for symptoms reported. Initially thought it was insect bite but symptoms have gotten worse. Wanting a provider to assess area.

## 2024-04-19 NOTE — ED Provider Notes (Signed)
 [facility]    CSN: [id number] Arrival date & time: 04/19/24  1418      History   Chief Complaint Chief Complaint  Patient presents with   Skin Problem    HPI [place] Kirchner is a 48 y.o. female.   Here with 5 day history of painful area on the right upper buttock She can't visualize the area, but feels its gotten bigger over the last few days. Thinks it's red. No drainage No interventions attempted  At first thought it was an insect bite   No fever  History of abscess  Last was in April. 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 Belinda, MD;  Location: [facility];  Service: General;  Laterality: N/A;    OB History   No obstetric history on file.      Home Medications    Prior to Admission medications   Medication Sig Start Date End Date Taking? Authorizing Provider  doxycycline  (VIBRAMYCIN ) 100 MG capsule Take 1 capsule (100 mg total) by mouth 2 (two) times daily for 7 days. 04/19/24 04/26/24 Yes Jovaun Levene, Asberry, PA-C  cetirizine (ZYRTEC) 10 MG tablet Take 10 mg by mouth daily.    [provider]  erythromycin  ophthalmic ointment Place a 1/2 inch ribbon of ointment into the lower eyelid. 09/09/21   Billy Asberry FALCON, PA-C  furosemide (LASIX) 20 MG tablet Take by mouth. 01/14/22   [provider]  ibuprofen   (ADVIL ,MOTRIN ) 200 MG tablet Take 400 mg by mouth every 6 (six) hours as needed. Pain.    [provider]  montelukast (SINGULAIR) 10 MG tablet Take 10 mg by mouth at bedtime. Patient not taking: Reported on 09/21/2023    [provider]  mupirocin  ointment (BACTROBAN ) 2 % Apply 1 Application topically 2 (two) times daily. 10/16/23   Mecum, Erin E, PA-C  ondansetron  (ZOFRAN  ODT) 4 MG disintegrating tablet Take 1 tablet (4 mg total) by mouth every 8 (eight) hours as needed for nausea or vomiting. 04/29/21   Zackowski, Scott, MD  potassium chloride  SA (KLOR-CON ) 20 MEQ tablet Take 1 tablet (20 mEq total) by mouth 2 (two) times daily. 04/29/21   Zackowski, Scott, MD  rosuvastatin (CRESTOR) 10 MG tablet Take 10 mg by mouth daily. Patient not taking: Reported on 09/21/2023    [provider]    Family History Family History  Problem Relation Age of Onset   Cancer Mother        breast   Diabetes Father    CAD Father    Cancer Sister        breast   Cancer Paternal Grandfather        lung    Social History Social History   Tobacco Use   Smoking status: Former    Current packs/day: 0.50    Average  packs/day: 0.5 packs/day for 17.0 years (8.5 ttl pk-yrs)    Types: Cigarettes   Smokeless tobacco: Never   Tobacco comments:    6 cigs per day  Vaping Use   Vaping status: Never Used  Substance Use Topics   Alcohol use: No   Drug use: No     Allergies   Codeine, Penicillins, Sulfa antibiotics, Ciprofloxacin , and Iodine   Review of Systems Review of Systems  As per HPI  Physical Exam Triage Vital Signs ED Triage Vitals  Encounter Vitals Group     BP 04/19/24 1454 (!) 146/84     Girls Systolic BP Percentile --      Girls Diastolic BP Percentile --      Boys Systolic BP Percentile --      Boys Diastolic BP Percentile --      Pulse Rate 04/19/24 1454 84     Resp 04/19/24 1454 18     Temp 04/19/24 1454 98.1 F (36.7 C)     Temp Source 04/19/24 1454  Oral     SpO2 04/19/24 1454 94 %     Weight 04/19/24 1452 234 lb (106.1 kg)     Height 04/19/24 1452 5' 5 (1.651 m)     Head Circumference --      Peak Flow --      Pain Score 04/19/24 1452 3     Pain Loc --      Pain Education --      Exclude from Growth Chart --    No data found.  Updated Vital Signs BP (!) 146/84 (BP Location: Right Arm)   Pulse 84   Temp 98.1 F (36.7 C) (Oral)   Resp 18   Ht 5' 5 (1.651 m)   Wt 234 lb (106.1 kg)   LMP 09/05/2014   SpO2 94%   BMI 38.94 kg/m   Visual Acuity Right Eye Distance:   Left Eye Distance:   Bilateral Distance:    Right Eye Near:   Left Eye Near:    Bilateral Near:     Physical Exam Vitals and nursing note reviewed. Exam conducted with a chaperone present (Hailey RT).  Constitutional:      General: She is not in acute distress.    Appearance: Normal appearance.  HENT:     Mouth/Throat:     Pharynx: Oropharynx is clear.  Cardiovascular:     Rate and Rhythm: Normal rate and regular rhythm.     Pulses: Normal pulses.     Heart sounds: Normal heart sounds.  Pulmonary:     Effort: Pulmonary effort is normal.     Breath sounds: Normal breath sounds.  Skin:    Findings: Abscess and erythema present.         Comments: Right upper glute with induration about 3 cm diameter. Erythematous and tender. Central punctate lesion. No drainage. Not fluctuant   Neurological:     Mental Status: She is alert and oriented to person, place, and time.      UC Treatments / Results  Labs (all labs ordered are listed, but only abnormal results are displayed) Labs Reviewed - No data to display  EKG   Radiology No results found.  Procedures Procedures (including critical care time)  Medications Ordered in UC Medications - No data to display  Initial Impression / Assessment and Plan / UC Course  I have reviewed the triage vital signs and the nursing notes.  Pertinent labs & imaging results that were available during  my  care of the patient were reviewed by me and considered in my medical decision making (see chart for details).  Skin abscess Doxycycline  BID x 7 days Wound care, pain control, supportive care Return precautions Agrees to plan, no questions   Final Clinical Impressions(s) / UC Diagnoses   Final diagnoses:  Cutaneous abscess of buttock     Discharge Instructions      Please take medication doxycycline  as prescribed. Take with food to avoid upset stomach. Finish the full course - you should not have any leftover!  Keep area clean - wash daily with mild soap and water. You can also use hibiclens soap  Ibuprofen  and/or tylenol  for pain Warm compress, warm soaks, or hot pad   Please return if needed     ED Prescriptions     Medication Sig Dispense Auth. Provider   doxycycline  (VIBRAMYCIN ) 100 MG capsule Take 1 capsule (100 mg total) by mouth 2 (two) times daily for 7 days. 14 capsule Daralyn Bert, Asberry, PA-C      PDMP not reviewed this encounter.   Jeryl Asberry, PA-C 04/19/24 [id number]

## 2024-04-19 NOTE — Discharge Instructions (Signed)
 Please take medication doxycycline  as prescribed. Take with food to avoid upset stomach. Finish the full course - you should not have any leftover!  Keep area clean - wash daily with mild soap and water. You can also use hibiclens soap  Ibuprofen  and/or tylenol  for pain Warm compress, warm soaks, or hot pad   Please return if needed

## 2024-06-25 ENCOUNTER — Ambulatory Visit
Admission: RE | Admit: 2024-06-25 | Discharge: 2024-06-25 | Disposition: A | Discharge status: Home / Self Care | Source: Ambulatory Visit | Attending: Emergency Medicine

## 2024-06-25 VITALS — BP 147/89 | HR 87 | Temp 98.3°F | Resp 18

## 2024-06-25 DIAGNOSIS — J019 Acute sinusitis, unspecified: Secondary | ICD-10-CM

## 2024-06-25 DIAGNOSIS — B9689 Other specified bacterial agents as the cause of diseases classified elsewhere: Secondary | ICD-10-CM

## 2024-06-25 MED ORDER — BENZONATATE 100 MG PO CAPS: 100.0000 mg | ORAL_CAPSULE | Freq: Three times a day (TID) | ORAL | 0 refills | Status: AC

## 2024-06-25 MED ORDER — CEFDINIR 300 MG PO CAPS: 300.0000 mg | ORAL_CAPSULE | Freq: Two times a day (BID) | ORAL | 0 refills | Status: AC

## 2024-06-25 NOTE — ED Provider Notes (Signed)
 " [facility]    CSN: [id number] Arrival date & time: 06/25/24  1033      History   Chief Complaint Chief Complaint  Patient presents with   Appointment    HPI Deborah Dodson is a 49 y.o. female.   Patient presents to clinic over concern of ongoing congestion, sinus pressure and mild cough x7 days   Nasal congestion, drainage and a little cough  Had headache yesterday for a few hours. Sinus headache  Covid and flu testing at home on Monday was negative  Taking regular vitamins and daily vitamins with elderberry  Low-grade fevers, these have improved  Sinus pain and pressure  Was exposed to the flu around christmas time   The history is provided by the patient and medical records.    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 Belinda, MD;  Location: [facility];  Service: General;  Laterality: N/A;    OB History   No obstetric history on file.      Home Medications    Prior to Admission medications  Medication Sig Start Date End Date Taking? Authorizing Provider  benzonatate (TESSALON) 100 MG capsule Take 1 capsule (100 mg total) by mouth every 8 (eight) hours. 06/25/24  Yes Gleen Ripberger  N, FNP  cefdinir  (OMNICEF ) 300 MG capsule Take 1 capsule (300 mg total) by mouth 2 (two) times daily for 7 days. 06/25/24 07/02/24 Yes Dreama, Kristelle Cavallaro  N, FNP    Family  History Family History  Problem Relation Age of Onset   Cancer Mother        breast   Diabetes Father    CAD Father    Cancer Sister        breast   Cancer Paternal Grandfather        lung    Social History Social History[1]   Allergies   Codeine, Penicillins, Sulfa antibiotics, Ciprofloxacin , and Iodine   Review of Systems Review of Systems  Per HPI  Physical Exam Triage Vital Signs ED Triage Vitals  Encounter Vitals Group     BP 06/25/24 1101 (!) 147/89     Girls Systolic BP Percentile --      Girls Diastolic BP Percentile --      Boys Systolic BP Percentile --      Boys Diastolic BP Percentile --      Pulse Rate 06/25/24 1101 87     Resp 06/25/24 1101 18     Temp 06/25/24 1101 98.3 F (36.8 C)     Temp Source 06/25/24 1101 Oral     SpO2 06/25/24 1101 96 %     Weight --      Height --      Head Circumference --      Peak Flow --      Pain Score 06/25/24 1059 0  Pain Loc --      Pain Education --      Exclude from Growth Chart --    No data found.  Updated Vital Signs BP (!) 147/89 (BP Location: Right Arm)   Pulse 87   Temp 98.3 F (36.8 C) (Oral)   Resp 18   LMP 09/05/2014   SpO2 96%   Visual Acuity Right Eye Distance:   Left Eye Distance:   Bilateral Distance:    Right Eye Near:   Left Eye Near:    Bilateral Near:     Physical Exam Vitals and nursing note reviewed.  Constitutional:      Appearance: Normal appearance.  HENT:     Head: Normocephalic and atraumatic.     Right Ear: External ear normal.     Left Ear: External ear normal.     Nose: Congestion and rhinorrhea present.     Mouth/Throat:     Mouth: Mucous membranes are moist.     Pharynx: Posterior oropharyngeal erythema present.  Eyes:     Conjunctiva/sclera: Conjunctivae normal.  Cardiovascular:     Rate and Rhythm: Normal rate and regular rhythm.     Heart sounds: Normal heart sounds. No murmur heard. Pulmonary:     Effort: Pulmonary effort is normal. No  respiratory distress.     Breath sounds: Normal breath sounds. No wheezing.  Skin:    General: Skin is warm and dry.  Neurological:     General: No focal deficit present.     Mental Status: She is alert and oriented to person, place, and time.  Psychiatric:        Mood and Affect: Mood normal.        Behavior: Behavior normal.      UC Treatments / Results  Labs (all labs ordered are listed, but only abnormal results are displayed) Labs Reviewed - No data to display  EKG   Radiology No results found.  Procedures Procedures (including critical care time)  Medications Ordered in UC Medications - No data to display  Initial Impression / Assessment and Plan / UC Course  I have reviewed the triage vital signs and the nursing notes.  Pertinent labs & imaging results that were available during my care of the patient were reviewed by me and considered in my medical decision making (see chart for details).  Vitals and triage reviewed, patient is hemodynamically stable.  Lungs vesicular, heart with regular rate and rhythm.  Congestion, rhinorrhea and posterior pharynx erythema present.  Patient with maxillary sinus tenderness.  Due to symptoms and duration will cover with cefdinir  for acute bacterial sinusitis.  Pain management, cough management and follow-up care discussed.  No questions at this time.     Final Clinical Impressions(s) / UC Diagnoses   Final diagnoses:  Acute bacterial sinusitis     Discharge Instructions      Take the antibiotics twice daily with food.  You can use the Tessalon Perles every 8 hours for cough.  Over-the-counter Mucinex can help break up any congestion, 1200 mg daily.  Consider over-the-counter sinus rinses with filtered water to help manually clear the sinuses.  Symptoms should improve over the next few days with antibiotics.  If no improvement or any changes seek follow-up care.     ED Prescriptions     Medication Sig Dispense  Auth. Provider   cefdinir  (OMNICEF ) 300 MG capsule Take 1 capsule (300 mg total) by mouth 2 (two) times daily for 7 days. 14 capsule  Dreama, Aneth Schlagel  N, FNP   benzonatate (TESSALON) 100 MG capsule Take 1 capsule (100 mg total) by mouth every 8 (eight) hours. 21 capsule Dreama, Marquee Fuchs  N, FNP      PDMP not reviewed this encounter.     [1]  Social History Tobacco Use   Smoking status: Former    Current packs/day: 0.50    Average packs/day: 0.5 packs/day for 17.0 years (8.5 ttl pk-yrs)    Types: Cigarettes   Smokeless tobacco: Never   Tobacco comments:    6 cigs per day  Vaping Use   Vaping status: Never Used  Substance Use Topics   Alcohol use: No   Drug use: No     Dreama Laurita SAILOR, FNP 06/25/24 1129  "

## 2024-06-25 NOTE — ED Triage Notes (Addendum)
 Pt reports for a week have cough, congestion, low grade fevers, fatigued. Taking Vit C and orange juice, elderberry and vitamins.  Pt reports yesterday had headache for 5 hours.  Pt states that she had home tests that were negative for covid/flu.

## 2024-06-25 NOTE — Discharge Instructions (Addendum)
 Take the antibiotics twice daily with food.  You can use the Tessalon Perles every 8 hours for cough.  Over-the-counter Mucinex can help break up any congestion, 1200 mg daily.  Consider over-the-counter sinus rinses with filtered water to help manually clear the sinuses.  Symptoms should improve over the next few days with antibiotics.  If no improvement or any changes seek follow-up care.
